# Patient Record
Sex: Male | Born: 1965 | Race: White | Hispanic: No | Marital: Married | Smoking: Never smoker
Health system: Southern US, Community
[De-identification: ages and names within clinical notes are randomized; demographics above are authoritative.]

## PROBLEM LIST (undated history)

## (undated) DIAGNOSIS — K219 Gastro-esophageal reflux disease without esophagitis: Secondary | ICD-10-CM

## (undated) DIAGNOSIS — F419 Anxiety disorder, unspecified: Secondary | ICD-10-CM

## (undated) DIAGNOSIS — N200 Calculus of kidney: Secondary | ICD-10-CM

## (undated) DIAGNOSIS — E785 Hyperlipidemia, unspecified: Secondary | ICD-10-CM

## (undated) HISTORY — PX: POLYPECTOMY: SHX149

## (undated) HISTORY — PX: TONSILLECTOMY: SUR1361

## (undated) HISTORY — DX: Anxiety disorder, unspecified: F41.9

## (undated) HISTORY — DX: Hyperlipidemia, unspecified: E78.5

## (undated) HISTORY — PX: UMBILICAL HERNIA REPAIR: SUR1181

## (undated) HISTORY — PX: COLONOSCOPY: SHX174

## (undated) HISTORY — PX: UMBILICAL HERNIA REPAIR: SHX196

## (undated) HISTORY — DX: Calculus of kidney: N20.0

## (undated) HISTORY — DX: Gastro-esophageal reflux disease without esophagitis: K21.9

---

## 2010-03-28 ENCOUNTER — Encounter: Payer: Self-pay | Admitting: Internal Medicine

## 2010-03-31 ENCOUNTER — Encounter: Payer: Self-pay | Admitting: Internal Medicine

## 2010-03-31 ENCOUNTER — Encounter: Admission: RE | Admit: 2010-03-31 | Discharge: 2010-03-31 | Payer: Self-pay | Admitting: Family Medicine

## 2010-09-15 ENCOUNTER — Encounter (INDEPENDENT_AMBULATORY_CARE_PROVIDER_SITE_OTHER): Payer: Self-pay | Admitting: *Deleted

## 2010-09-15 ENCOUNTER — Ambulatory Visit: Payer: Self-pay | Admitting: Internal Medicine

## 2010-09-15 DIAGNOSIS — Z87442 Personal history of urinary calculi: Secondary | ICD-10-CM | POA: Insufficient documentation

## 2010-09-15 DIAGNOSIS — R198 Other specified symptoms and signs involving the digestive system and abdomen: Secondary | ICD-10-CM

## 2010-09-15 DIAGNOSIS — R358 Other polyuria: Secondary | ICD-10-CM

## 2010-09-15 DIAGNOSIS — E669 Obesity, unspecified: Secondary | ICD-10-CM | POA: Insufficient documentation

## 2010-09-15 DIAGNOSIS — R5381 Other malaise: Secondary | ICD-10-CM

## 2010-09-15 DIAGNOSIS — R5383 Other fatigue: Secondary | ICD-10-CM

## 2010-09-15 DIAGNOSIS — L989 Disorder of the skin and subcutaneous tissue, unspecified: Secondary | ICD-10-CM | POA: Insufficient documentation

## 2010-09-15 LAB — CONVERTED CEMR LAB
AST: 24 units/L (ref 0–37)
Basophils Relative: 0 % (ref 0–1)
Bilirubin, Direct: 0.2 mg/dL (ref 0.0–0.3)
CO2: 26 meq/L (ref 19–32)
Chloride: 105 meq/L (ref 96–112)
Cholesterol: 258 mg/dL — ABNORMAL HIGH (ref 0–200)
Creatinine, Ser: 0.94 mg/dL (ref 0.40–1.50)
HDL: 43 mg/dL (ref >39)
Lymphs Abs: 2.4 10*3/uL (ref 0.7–4.0)
MCHC: 33.9 g/dL (ref 30.0–36.0)
Monocytes Absolute: 0.6 10*3/uL (ref 0.1–1.0)
Monocytes Relative: 8 % (ref 3–12)
Neutro Abs: 4.4 10*3/uL (ref 1.7–7.7)
Neutrophils Relative %: 58 % (ref 43–77)
Potassium: 3.9 meq/L (ref 3.5–5.3)
RBC: 5.19 M/uL (ref 4.22–5.81)
RDW: 12.6 % (ref 11.5–15.5)
Sodium: 144 meq/L (ref 135–145)
TSH: 1.541 microintl units/mL (ref 0.350–4.500)
Total Protein: 7.2 g/dL (ref 6.0–8.3)
VLDL: 47 mg/dL — ABNORMAL HIGH (ref 0–40)

## 2010-09-16 ENCOUNTER — Encounter: Payer: Self-pay | Admitting: Internal Medicine

## 2010-09-16 ENCOUNTER — Encounter (INDEPENDENT_AMBULATORY_CARE_PROVIDER_SITE_OTHER): Payer: Self-pay | Admitting: *Deleted

## 2010-09-16 ENCOUNTER — Telehealth: Payer: Self-pay | Admitting: Internal Medicine

## 2010-09-16 LAB — CONVERTED CEMR LAB: CRP, High Sensitivity: 2.4

## 2010-09-26 ENCOUNTER — Encounter: Payer: Self-pay | Admitting: Internal Medicine

## 2010-10-02 ENCOUNTER — Encounter: Payer: Self-pay | Admitting: Internal Medicine

## 2010-10-13 ENCOUNTER — Ambulatory Visit: Payer: Self-pay | Admitting: Internal Medicine

## 2010-10-13 DIAGNOSIS — E785 Hyperlipidemia, unspecified: Secondary | ICD-10-CM

## 2010-10-13 LAB — CONVERTED CEMR LAB
ALT: 28 units/L (ref 0–53)
Albumin: 4.8 g/dL (ref 3.5–5.2)
Alkaline Phosphatase: 77 units/L (ref 39–117)
Total Protein: 7 g/dL (ref 6.0–8.3)

## 2010-10-15 ENCOUNTER — Telehealth: Payer: Self-pay | Admitting: Internal Medicine

## 2010-10-16 ENCOUNTER — Encounter (INDEPENDENT_AMBULATORY_CARE_PROVIDER_SITE_OTHER): Payer: Self-pay | Admitting: *Deleted

## 2010-10-16 ENCOUNTER — Encounter: Payer: Self-pay | Admitting: Internal Medicine

## 2010-10-22 ENCOUNTER — Ambulatory Visit: Payer: Self-pay | Admitting: Internal Medicine

## 2010-10-22 ENCOUNTER — Encounter (INDEPENDENT_AMBULATORY_CARE_PROVIDER_SITE_OTHER): Payer: Self-pay | Admitting: *Deleted

## 2010-11-03 ENCOUNTER — Ambulatory Visit: Payer: Self-pay | Admitting: Internal Medicine

## 2010-11-03 LAB — HM COLONOSCOPY

## 2010-11-05 ENCOUNTER — Encounter: Payer: Self-pay | Admitting: Internal Medicine

## 2011-01-01 ENCOUNTER — Telehealth: Payer: Self-pay | Admitting: Internal Medicine

## 2011-01-20 NOTE — Letter (Signed)
Summary: New Patient letter  [facility]  [address] [place], [place] [address]   Phone: [phone]  Fax: [phone]       09/15/2010 MRN: [id number]  [facility] [address] [place], [place]  [address]  Dear Mr. Jorge Guerra,  Welcome to the Gastroenterology Division at [facility].    You are scheduled to see Dr. Leone Payor on 11/03/2010 at 2:00PM on the 3rd floor at [facility], [address]  We ask that you try to arrive at our office 15 minutes prior to your appointment time to allow for check-in.  We would like you to complete the enclosed self-administered evaluation form prior to your visit and bring it with you on the day of your appointment.  We will review it with you.  Also, please bring a complete list of all your medications or, if you prefer, bring the medication bottles and we will list them.  Please bring your insurance card so that we may make a copy of it.  If your insurance requires a referral to see a specialist, please bring your referral form from your primary care physician.  Co-payments are due at the time of your visit and may be paid by cash, check or credit card.     Your office visit will consist of a consult with your physician (includes a physical exam), any laboratory testing he/she may order, scheduling of any necessary diagnostic testing (e.g. x-ray, ultrasound, CT-scan), and scheduling of a procedure (e.g. Endoscopy, Colonoscopy) if required.  Please allow enough time on your schedule to allow for any/all of these possibilities.    If you cannot keep your appointment, please call [phone] to cancel or reschedule prior to your appointment date.  This allows [place] the opportunity to schedule an appointment for another patient in need of care.  If you do not cancel or reschedule by 5 p.m. the business day prior to your appointment date, you will be charged a $50.00 late cancellation/no-show fee.    Thank you for choosing [facility] for your medical needs.  We appreciate the opportunity to care for you.  Please visit [place] at our website  to learn more about our practice.                     Sincerely,                                                             The Gastroenterology Division

## 2011-01-20 NOTE — Consult Note (Signed)
Summary: [facility]  [facility]   Imported By: Lanelle Bal 11/05/2010 08:33:55  _____________________________________________________________________  External Attachment:    Type:   Image     Comment:   External Document

## 2011-01-20 NOTE — Letter (Signed)
Summary: [facility] Instructions  [facility]  [address] [place], [place] [address]   Phone: [phone]  Fax: [phone]       Jorge Guerra    [birth year]    MRN: [id number]        Procedure Day Dorna Bloom:  Jorge Guerra  11/03/10     Arrival Time:  8:00AM      Procedure Time:  9:00AM     Location of Procedure:                    _ X_  [facility] (4th Floor)                      PREPARATION FOR COLONOSCOPY WITH MOVIPREP   Starting 5 days prior to your procedure 10/29/10 do not eat nuts, seeds, popcorn, corn, beans, peas,  salads, or any raw vegetables.  Do not take any fiber supplements (e.g. Metamucil, Citrucel, and Benefiber).  THE DAY BEFORE YOUR PROCEDURE         DATE: 11/02/10  DAY: SUNDAY  1.  Drink clear liquids the entire day-NO SOLID FOOD  2.  Do not drink anything colored red or purple.  Avoid juices with pulp.  No orange juice.  3.  Drink at least 64 oz. (8 glasses) of fluid/clear liquids during the day to prevent dehydration and help the prep work efficiently.  CLEAR LIQUIDS INCLUDE: Water Jello Ice Popsicles Tea (sugar ok, no milk/cream) Powdered fruit flavored drinks Coffee (sugar ok, no milk/cream) Gatorade Juice: apple, white grape, white cranberry  Lemonade Clear bullion, consomm, broth Carbonated beverages (any kind) Strained chicken noodle soup Hard Candy                             4.  In the morning, mix first dose of MoviPrep solution:    Empty 1 Pouch A and 1 Pouch B into the disposable container    Add lukewarm drinking water to the top line of the container. Mix to dissolve    Refrigerate (mixed solution should be used within 24 hrs)  5.  Begin drinking the prep at 5:00 p.m. The MoviPrep container is divided by 4 marks.   Every 15 minutes drink the solution down to the next Jorge Guerra (approximately 8 oz) until the full liter is complete.   6.  Follow completed prep with 16 oz of clear liquid of your choice (Nothing  red or purple).  Continue to drink clear liquids until bedtime.  7.  Before going to bed, mix second dose of MoviPrep solution:    Empty 1 Pouch A and 1 Pouch B into the disposable container    Add lukewarm drinking water to the top line of the container. Mix to dissolve    Refrigerate  THE DAY OF YOUR PROCEDURE      DATE: 47/14/11   DAY: MONDAY  Beginning at 4:00AM (5 hours before procedure):         1. Every 15 minutes, drink the solution down to the next Jorge Guerra (approx 8 oz) until the full liter is complete.  2. Follow completed prep with 16 oz. of clear liquid of your choice.    3. You may drink clear liquids until 7:00AM (2 HOURS BEFORE PROCEDURE).   MEDICATION INSTRUCTIONS  Unless otherwise instructed, you should take regular prescription medications with a small sip of water   as early as possible the morning  of your procedure.        OTHER INSTRUCTIONS  You will need a responsible adult at least 47 years of age to accompany you and drive you home.   This person must remain in the waiting room during your procedure.  Wear loose fitting clothing that is easily removed.  Leave jewelry and other valuables at home.  However, you may wish to bring a book to read or  an iPod/MP3 player to listen to music as you wait for your procedure to start.  Remove all body piercing jewelry and leave at home.  Total time from sign-in until discharge is approximately 2-3 hours.  You should go home directly after your procedure and rest.  You can resume normal activities the  day after your procedure.  The day of your procedure you should not:   Drive   Make legal decisions   Operate machinery   Drink alcohol   Return to work  You will receive specific instructions about eating, activities and medications before you leave.    The above instructions have been reviewed and explained to me by   [name] RN  October 22, 2010 2:24 PM    I fully understand and can  verbalize these instructions _____________________________ Date _________

## 2011-01-20 NOTE — Letter (Signed)
Summary: Patient Notice- Polyp Results  [facility]  [address] [place], [place] [address]   Phone: [phone]  Fax: [phone]        November 05, 2010 MRN: [id number]    [facility] [address] [place], [place]  [address]    Dear Jorge Guerra,  I am pleased to inform you that the colon polyp(s) removed during your recent colonoscopy was (were) found to be benign (no cancer detected) upon pathologic examination.  I recommend you have a repeat colonoscopy examination in 3 years to look for recurrent polyps, as having colon polyps increases your risk for having recurrent polyps or even colon cancer in the future.  Should you develop new or worsening symptoms of abdominal pain, bowel habit changes or bleeding from the rectum or bowels, please schedule an evaluation with either your primary care physician or with me.  Additional information/recommendations:  __ No further action with gastroenterology is needed at this time. Please      follow-up with your primary care physician for your other healthcare      needs.    Please call us if you are having persistent problems or have questions about your condition that have not been fully answered at this time.  Sincerely,  Hilarie Fredrickson MD  This letter has been electronically signed by your physician.  Appended Document: Patient Notice- Polyp Results Letter mailed

## 2011-01-20 NOTE — Letter (Signed)
Summary: [facility]  [facility]   Imported By: Lanelle Bal 09/23/2010 10:11:13  _____________________________________________________________________  External Attachment:    Type:   Image     Comment:   External Document

## 2011-01-20 NOTE — Procedures (Signed)
Summary: Colonoscopy  Patient: [name] Note: All result statuses are Final unless otherwise noted.  Tests: (1) Colonoscopy (COL)   COL Colonoscopy           DONE     [facility]     [address]     [place], [place]  [address]           COLONOSCOPY PROCEDURE REPORT           PATIENT:  Jorge Guerra, Jorge Guerra  MR#:  [id number]     BIRTHDATE:  03-Nov-1966, 44 yrs. old  GENDER:  male     ENDOSCOPIST:  Wilhemina Bonito. Eda Keys, MD     REF. BY:  Thomos Lemons, DO     PROCEDURE DATE:  11/03/2010     PROCEDURE:  Colonoscopy with snare polypectomy x 4     ASA CLASS:  Class II     INDICATIONS:  Elevated Risk Screening ; Family hx "cancer", not     certain it's colon     MEDICATIONS:   Fentanyl 75 mcg IV, Versed 7 mg IV           DESCRIPTION OF PROCEDURE:   After the risks benefits and     alternatives of the procedure were thoroughly explained, informed     consent was obtained.  Digital rectal exam was performed and     revealed no abnormalities.   The LB CF-[id number] endoscope     was introduced through the anus and advanced to the cecum, which     was identified by both the appendix and ileocecal valve, without     limitations.Time to cecum = 2:25 min.  The quality of the prep was     excellent, using MoviPrep.  The instrument was then slowly     withdrawn (time = 15:02 min) as the colon was fully examined.     <<PROCEDUREIMAGES>>           FINDINGS:  A diminutive polyp was found in the ascending colon.     Polyp was snared without cautery. Retrieval was successful.  A     15mm pedunculated polyp was found in the descending colon. Polyp     was snared, then cauterized with monopolar cautery. Retrieval was     successful.   Two 2mm polyps were found in the rectum. Polyps were     snared without cautery. Retrieval was successful.    Retroflexed     views in the rectum revealed internal hemorrhoids.    The scope     was then withdrawn from the patient and the procedure completed.       COMPLICATIONS:  None     ENDOSCOPIC IMPRESSION:     1) Diminutive polyp in the ascending colon - removed     2) 15mm Pedunculated polyp in the descending colon - removed     3) Two tiny polyps in the rectum - removed     4) Internal hemorrhoids     RECOMMENDATIONS:     1) Follow up colonoscopy in 3 years     ______________________________     Wilhemina Bonito. Eda Keys, MD           CC:  Thomos Lemons, DO;  The Patient           n.     eSIGNED:   John N. Eda Keys at 11/03/2010 09:41 AM           Dorothy Puffer, [id number]  Note: An exclamation Zhyon (!) indicates a result that was not dispersed into the flowsheet. Document Creation Date: 11/03/2010 9:42 AM _______________________________________________________________________  (1) Order result status: Final Collection or observation date-time: 11/03/2010 09:32 Requested date-time:  Receipt date-time:  Reported date-time:  Referring Physician:   Ordering Physician: Fransico Setters [phone]) Specimen Source:  Source: Launa Grill Order Number: [id number] Lab site:   Appended Document: colon recall     Procedures Next Due Date:    Colonoscopy: 10/2013

## 2011-01-20 NOTE — Miscellaneous (Signed)
Summary: [facility] Previsit/prep  Clinical Lists Changes  Medications: Added new medication of MOVIPREP 100 GM  SOLR (PEG-KCL-NACL-NASULF-NA ASC-C) As per prep instructions. - Signed Rx of MOVIPREP 100 GM  SOLR (PEG-KCL-NACL-NASULF-NA ASC-C) As per prep instructions.;  #1 x 0;  Signed;  Entered by: [name] RN;  Authorized by: Hilarie Fredrickson MD;  Method used: Electronically to [facility]  [phone]*, [address] [place], [place], [place]  [address], Ph: [phone] or [phone], Fax: [phone] Allergies: Added new allergy or adverse reaction of * RADIOACTIVE I V DYE Observations: Added new observation of NKA: F (10/22/2010 13:39)    Prescriptions: MOVIPREP 100 GM  SOLR (PEG-KCL-NACL-NASULF-NA ASC-C) As per prep instructions.  #1 x 0   Entered by:   [name] RN   Authorized by:   Hilarie Fredrickson MD   Signed by:   [name] RN on 10/22/2010   Method used:   Electronically to        [facility]  [address] [phone]* (retail)       [address]       [place], [place]  [address]       Ph: [phone] or [phone]       Fax: [phone]   RxID:   [id number]

## 2011-01-20 NOTE — Letter (Signed)
   [place] at [facility] [address] [place], [place]  [address]  [place] Phone: [phone]      October 16, 2010   [facility] Yeager [address] [place], [place] [address]  RE:  LAB RESULTS  Dear  Mr. Dettman,  The following is an interpretation of your most recent lab tests.  Please take note of any instructions provided or changes to medications that have resulted from your lab work.  LIVER FUNCTION TESTS:  Good - no changes needed  LIPID PANEL:  Good - no changes needed Triglyceride: 134   Cholesterol: 160   LDL: 88   HDL: 45   Chol/HDL%:  3.6 Ratio        Sincerely Yours,    Dr. Thomos Lemons  Appended Document:  mailed

## 2011-01-20 NOTE — Assessment & Plan Note (Signed)
Summary: new to be est cigna/mhf   Vital Signs:  Patient profile:   45 year old male Height:      68.5 inches Weight:      232.75 pounds BMI:     35.00 O2 Sat:      97 % on Room air Temp:     97.6 degrees F oral Pulse rate:   57 / minute Pulse rhythm:   regular Resp:     18 per minute BP sitting:   106 / 80  (right arm) Cuff size:   large  Vitals Entered By: Glendell Docker CMA (September 15, 2010 1:19 PM)  O2 Flow:  Room air CC: New Patient  Is Patient Diabetic? No Pain Assessment Patient in pain? no      Comments c/o abdominal discomfort, tightness in abdomen   Primary Care Provider:  Dondra Spry DO  CC:  New Patient .  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  they noted bradycardia  urine sample showed blood in the urine he had CT scan of abd and pelvis - reported normal  Current Diet: Breakfast: usually skips Lunch: sausage soup at     , [employer],  country fried steak and mashed potatoes,  occ fast food DInner: 2 sandwiches and bowel of soup,  love cheese sticks Snacks: ice cream,  chips, nabs Beverage:  sweet tea,   Preventive Screening-Counseling & Management  Alcohol-Tobacco     Alcohol drinks/day: twice per month-socially     Smoking Status: never  Caffeine-Diet-Exercise     Caffeine use/day: gallon daily     Does Patient Exercise: no  Allergies (verified): No Known Drug Allergies  Past History:  Past Medical History: Nephrolithiasis, hx of  Family History: Family History Diabetes 1st degree  relative Family History Hypertension Mother has CLL, fibromyalgia father died of copd,  DMII ( smoker, drinker)  Social History: Occupation: Environmental consultant (Chiropodist and Biochemist, clinical) Married 15 years - 4th marriage 2 biological sons 25, 73  2-  1 stepson 1 biological daughter 36, 2 step daughters 43, 32 (mother - Scientist, physiological) Alcohol use-yes ( rare , social ) Never Smoked Smoking Status:  never Caffeine use/day:  gallon daily Does Patient Exercise:  no  Review of Systems       poor sleep  Physical Exam  General:  alert and overweight-appearing.   Head:  normocephalic and atraumatic.   Eyes:  pupils equal, pupils round, and pupils reactive to light.   Ears:  decreased hearing left ear Mouth:  pharynx pink and moist and pharyngeal crowding.   Neck:  supple and no masses.  question fullness of bilateral sterno cleido mastoid muscles bilaterally Lungs:  normal respiratory effort, normal breath sounds, no crackles, and no wheezes.   Heart:  normal rate, regular rhythm, no murmur, and no gallop.   Abdomen:  obese, soft,  abd tenderness right of umbilicus,  no masses, no hepatomegaly, and no splenomegaly.  Extremities:  No lower extremity edema Neurologic:  cranial nerves II-XII intact and gait normal.   Skin:  3-4 mm hyperpigmented lesion right upper trapezius area.   multiple scattered moles on back, chest and abd Psych:  normally interactive, good eye contact, not anxious appearing, and not depressed appearing.     Impression & Recommendations:  Problem # 1:  OTHER SYMPTOMS INVOLVING DIGESTIVE SYSTEM OTHER (ICD-787.99)  pt with change in bowel habits.    Orders: Gastroenterology Referral (GI)  Problem # 2:  OBESITY (ICD-278.00) Pt counseled on diet and exercise.  Orders: T-Basic Metabolic Panel [id number]) T-Lipid Profile [id number]) T-Hepatic Function [phone]) T-CBC w/Diff [id number]) T-TSH [id number])  Problem # 3:  POLYURIA  (UUV-253.66) rule out hyperglycemia  Orders: T-Basic Metabolic Panel [id number]) T-Lipid Profile [id number]) T-Hepatic Function [phone]) T-CBC w/Diff [id number]) T-TSH [id number])  Problem # 4:  SKIN LESION (ICD-709.9)  hyperpigmented skin lesion right shoulder / upper back refer to [facility] biopsy  Orders: Dermatology Referral ([facility])  Complete Medication List: 1)  Crestor 10 Mg Tabs (Rosuvastatin calcium) .... One by mouth once daily  Patient Instructions: 1)  Please schedule a follow-up appointment in 1 month.  Prevention & Chronic Care Immunizations   Influenza vaccine: Not documented    Tetanus booster: Not documented    Pneumococcal vaccine: Not documented  Other Screening   Smoking status: never  (09/15/2010)  Lipids   Total Cholesterol: Not documented   LDL: Not documented   LDL Direct: Not documented   HDL: Not documented   Triglycerides: Not documented   Current Allergies (reviewed today): No known allergies

## 2011-01-20 NOTE — Progress Notes (Signed)
Summary: Lab Results  Phone Note Call from Patient Call back at Home Phone [phone]   Caller: Patient Call For: YOO  Summary of Call: PATIENT WOULD LIKE RESULTS OF HIS CHOLESTEROL TEST  Initial call taken by: Roselle Locus,  October 15, 2010 3:43 PM  Follow-up for Phone Call        cholesterol is much better.  I will send letter  Follow-up by: D. Thomos Lemons DO,  October 16, 2010 1:17 PM  Additional Follow-up for Phone Call Additional follow up Details #1::        call returned to patient at [phone], no answer. A detailed voice message was left informing patient per Dr Artist Pais instructions. Message was left for patient to call if any questions Additional Follow-up by: Glendell Docker CMA,  October 16, 2010 1:19 PM

## 2011-01-20 NOTE — Letter (Signed)
Summary: Primary Care Consult Scheduled Letter  [place] [facility]  [address]   [place], [place] [address]   Phone: [phone]  Fax: [phone]      09/16/2010 MRN: [id number]  [facility] [address] [place], [place]  [address]    Dear Mr. Goggins,      We have scheduled an appointment for you.  At the recommendation of Dr. Artist Pais, we have scheduled you a consult with [facility]  , Margarito Courser on OCTOBER 7,2011  at 10:30AM.  Their address is[address], [place] . The office phone number is [phone].  If this appointment day and time is not convenient for you, please feel free to call the office of the doctor you are being referred to at the number listed above and reschedule the appointment.     It is important for you to keep your scheduled appointments. We are here to make sure you are given good patient care. If you have questions or you have made changes to your appointment, please notify us at  [phone], ask for HELEN.    Thank you,  Patient Care Coordinator [place] [facility]

## 2011-01-20 NOTE — Progress Notes (Signed)
Summary: lab results   Phone Note Call from Patient Call back at Home Phone [phone]   Caller: patient  Call For: Jorge Guerra  Summary of Call: lab results   Initial call taken by: Roselle Locus,  September 16, 2010 12:12 PM  Follow-up for Phone Call        discussed lab results with pt pt to come in on Monday to pick up samples of crestor 10 mg  Follow-up by: D. Thomos Lemons DO,  September 16, 2010 6:28 PM  Additional Follow-up for Phone Call Additional follow up Details #1::        samples left at front desk for patient pick up Additional Follow-up by: Glendell Docker CMA,  September 17, 2010 8:30 AM    New/Updated Medications: CRESTOR 10 MG TABS (ROSUVASTATIN CALCIUM) one by mouth once daily Prescriptions: CRESTOR 10 MG TABS (ROSUVASTATIN CALCIUM) one by mouth once daily  #30 x 0   Entered and Authorized by:   D. Thomos Lemons DO   Signed by:   D. Thomos Lemons DO on 09/16/2010   Method used:   Historical   RxID:   [id number]

## 2011-01-20 NOTE — Letter (Signed)
Summary: [facility]  [facility]   Imported By: Lanelle Bal 09/23/2010 10:09:34  _____________________________________________________________________  External Attachment:    Type:   Image     Comment:   External Document

## 2011-01-20 NOTE — Assessment & Plan Note (Signed)
Summary: 1 month follow up/mhf   Vital Signs:  Patient profile:   45 year old male Height:      68.5 inches Weight:      230.75 pounds BMI:     34.70 O2 Sat:      98 % on Room air Temp:     98.2 degrees F oral Pulse rate:   61 / minute Pulse rhythm:   regular Resp:     18 per minute BP sitting:   90 / 70  (right arm) Cuff size:   large  Vitals Entered By: Glendell Docker CMA (October 13, 2010 3:06 PM)  O2 Flow:  Room air CC: 1 month follow up Is Patient Diabetic? No Pain Assessment Patient in pain? no      Comments fasting for blood work if needed, no concerns   Primary Care Provider:  D. Thomos Lemons DO  CC:  1 month follow up.  History of Present Illness:  Hyperlipidemia Follow-Up      This is a 46 year old man who presents for Hyperlipidemia follow-up.  The patient denies muscle aches and abdominal pain.  The patient denies the following symptoms: chest pain/pressure.  Compliance with medications (by patient report) has been near 100%.    lost 7 lbs   Preventive Screening-Counseling & Management  Alcohol-Tobacco     Smoking Status: never  Allergies: 1)  ! * Radioactive I V Dye  Past History:  Past Medical History: Nephrolithiasis, hx of   SH/Risk Factors reviewed for relevance  Family History: Family History Diabetes 1st degree relative Family History Hypertension Mother has CLL, fibromyalgia father died of copd,  DMII ( smoker, drinker)   Social History: Occupation: Environmental consultant (Chiropodist and Biochemist, clinical) Married 15 years - 4th marriage 2 biological sons 25, 31  2-  1 stepson 1 biological daughter 30, 2 step daughters 73, 33 (mother - Scientist, physiological) Alcohol use-yes ( rare , social )  Never Smoked  Physical Exam  General:  alert, well-developed, and well-nourished.   Lungs:  normal respiratory effort, normal breath sounds, no crackles, and no wheezes.   Heart:  normal rate, regular rhythm, no murmur, and no gallop.     Extremities:  No lower extremity edema   Impression & Recommendations:  Problem # 1:  HYPERLIPIDEMIA (ICD-272.4) Assessment Improved  The following medications were removed from the medication list:    Crestor 10 Mg Tabs (Rosuvastatin calcium) ..... One by mouth once daily His updated medication list for this problem includes:    Crestor 10 Mg Tabs (Rosuvastatin calcium) ..... One by mouth once daily  Orders: T-Hepatic Function [phone]) T-Lipid Profile [phone])  Labs Reviewed: SGOT: 24 (09/15/2010)   SGPT: 27 (09/15/2010)   HDL:43 (09/15/2010)  LDL:168 (09/15/2010)  Chol:258 (09/15/2010)  Trig:237 (09/15/2010)  Complete Medication List: 1)  Crestor 10 Mg Tabs (Rosuvastatin calcium) .... One by mouth once daily 2)  Moviprep 100 Gm Solr (Peg-kcl-nacl-nasulf-na asc-c) .... As per prep instructions.  Patient Instructions: 1)  Please schedule a follow-up appointment in 6 months. 2)  Hepatic Panel prior to visit, ICD-9:  272.4 3)  Lipid Panel prior to visit, ICD-9: 272.4 4)  Please return for lab work one (1) week before your next appointment.  Prescriptions: CRESTOR 10 MG TABS (ROSUVASTATIN CALCIUM) one by mouth once daily  #90 x 1   Entered and Authorized by:   D. Thomos Lemons DO   Signed by:   D. Thomos Lemons DO on 10/13/2010  Method used:   Electronically to        [facility]  [phone] (retail)       [address]       [place], [place]  [address]       Ph: [phone] or [phone]       Fax: [phone]   RxID:   [id number]    Orders Added: 1)  T-Hepatic Function [phone] 2)  T-Lipid Profile [phone] 3)  Est. Patient Level III [18841]   Immunization History:  Influenza Immunization History:    Influenza:  declined (10/13/2010)   Contraindications/Deferment of Procedures/Staging:    Test/Procedure: FLU VAX    Reason for deferment: patient declined   Immunization History:  Influenza Immunization History:     Influenza:  Declined (10/13/2010)   Current Allergies (reviewed today): No known allergies

## 2011-01-20 NOTE — Letter (Signed)
Summary: Pre Visit Letter Revised  [facility]  [address] [place], [place] [address]   Phone: [phone]  Fax: [phone]        10/16/2010 MRN: [id number] Jorge Guerra [address] [place], [place]  [address]             Procedure Date:  11-03-10   Welcome to the Gastroenterology Division at [facility].    You are scheduled to see a nurse for your pre-procedure visit on 10-22-10 at 2:00p.m. on the 3rd floor at [facility], [address]  We ask that you try to arrive at our office 15 minutes prior to your appointment time to allow for check-in.  Please take a minute to review the attached form.  If you answer "Yes" to one or more of the questions on the first page, we ask that you call the person listed at your earliest opportunity.  If you answer "No" to all of the questions, please complete the rest of the form and bring it to your appointment.    Your nurse visit will consist of discussing your medical and surgical history, your immediate family medical history, and your medications.   If you are unable to list all of your medications on the form, please bring the medication bottles to your appointment and we will list them.  We will need to be aware of both prescribed and over the counter drugs.  We will need to know exact dosage information as well.    Please be prepared to read and sign documents such as consent forms, a financial agreement, and acknowledgement forms.  If necessary, and with your consent, a friend or relative is welcome to sit-in on the nurse visit with you.  Please bring your insurance card so that we may make a copy of it.  If your insurance requires a referral to see a specialist, please bring your referral form from your primary care physician.  No co-pay is required for this nurse visit.     If you cannot keep your appointment, please call [phone] to cancel or reschedule prior to your appointment date.  This allows [place] the  opportunity to schedule an appointment for another patient in need of care.    Thank you for choosing  Gastroenterology for your medical needs.  We appreciate the opportunity to care for you.  Please visit [place] at our website  to learn more about our practice.  Sincerely, The Gastroenterology Division

## 2011-01-21 ENCOUNTER — Encounter: Payer: Self-pay | Admitting: Internal Medicine

## 2011-01-22 NOTE — Progress Notes (Signed)
Summary: refill--crestor  Phone Note Refill Request Message from:  Fax from [facility] Delivery on January 01, 2011 2:21 PM  Refills Requested: Medication #1:  CRESTOR 10 MG TABS one by mouth once daily.   Dosage confirmed as above?Dosage Confirmed   Supply Requested: 3 months   Last Refilled: 10/13/2010 Member ID [id number]  Next Appointment Scheduled: 03-30-11  Dr Artist Pais Initial call taken by: Mervin Kung CMA Duncan Dull),  January 01, 2011 2:21 PM    Prescriptions: CRESTOR 10 MG TABS (ROSUVASTATIN CALCIUM) one by mouth once daily  #90 x 0   Entered by:   Mervin Kung CMA (AAMA)   Authorized by:   D. Thomos Lemons DO   Signed by:   Mervin Kung CMA (AAMA) on 01/01/2011   Method used:   Faxed to ...       [address] [facility] (mail-order)       [address]       [place], [place]  [address]       Ph: [phone]       Fax: [phone]   RxID:   [id number]

## 2011-03-30 ENCOUNTER — Ambulatory Visit: Payer: Self-pay | Admitting: Internal Medicine

## 2011-04-09 ENCOUNTER — Ambulatory Visit: Payer: Self-pay | Admitting: Internal Medicine

## 2011-04-10 ENCOUNTER — Encounter: Payer: Self-pay | Admitting: Internal Medicine

## 2011-04-10 ENCOUNTER — Ambulatory Visit (INDEPENDENT_AMBULATORY_CARE_PROVIDER_SITE_OTHER): Payer: Managed Care, Other (non HMO) | Admitting: Internal Medicine

## 2011-04-10 DIAGNOSIS — R0609 Other forms of dyspnea: Secondary | ICD-10-CM

## 2011-04-10 DIAGNOSIS — R358 Other polyuria: Secondary | ICD-10-CM

## 2011-04-10 DIAGNOSIS — R0600 Dyspnea, unspecified: Secondary | ICD-10-CM

## 2011-04-10 DIAGNOSIS — M2550 Pain in unspecified joint: Secondary | ICD-10-CM

## 2011-04-10 LAB — CBC
HCT: 44.8 % (ref 39.0–52.0)
Hemoglobin: 15.2 g/dL (ref 13.0–17.0)
MCH: 29.5 pg (ref 26.0–34.0)
MCHC: 33.9 g/dL (ref 30.0–36.0)
MCV: 87 fL (ref 78.0–100.0)
Platelets: 207 10*3/uL (ref 150–400)
RBC: 5.15 MIL/uL (ref 4.22–5.81)
RDW: 12.9 % (ref 11.5–15.5)

## 2011-04-10 LAB — HEMOGLOBIN A1C: Mean Plasma Glucose: 108 mg/dL (ref <117)

## 2011-04-10 LAB — URIC ACID: Uric Acid, Serum: 5.8 mg/dL (ref 4.0–7.8)

## 2011-04-10 NOTE — Patient Instructions (Signed)
Use right wrist splint x 4 weeks Perform stretching exercises as directed for 2-4 weeks

## 2011-04-10 NOTE — Progress Notes (Signed)
  Subjective:    Patient ID: Jorge Guerra, male    DOB: 1966/11/15, 45 y.o.   MRN: [id number]  HPI 45 y/o male for follow up.  Hyperlipidemia -  1 month ago - he stopped taking crestor due to right hand ache and left hip.  His symptoms did not resolve with cessation of statin medication.  Last week played golf  - got short of breath and  unusual fatigue.  Denies angina but notes uncomfortable chest sensation   Review of Systems    chronic aches and pains,  Negative for cough  Past Medical History  Diagnosis Date  . Nephrolithiasis     Hx of microscopic hematuria.  Negative CT of abd and Pelvis for renal lesion    History   Social History  . Marital Status: Married    Spouse Name: N/A    Number of Children: 3  . Years of Education: N/A   Occupational History  . General Restaurant manager, fast food Store    Social History Main Topics  . Smoking status: Never Smoker   . Smokeless tobacco: Not on file  . Alcohol Use: Yes  . Drug Use:   . Sexually Active:    Other Topics Concern  . Not on file   Social History Narrative   Has 1 stepson and 2 step daughters.    No past surgical history on file.  Family History  Problem Relation Age of Onset  . Leukemia Mother     CLL  . Fibromyalgia Mother   . COPD Father   . Diabetes Father     type II    No Known Allergies  No current outpatient prescriptions on file prior to visit.    BP 112/80  Pulse 59  Temp(Src) 97.9 F (36.6 C) (Oral)  Wt 222 lb (100.699 kg)  SpO2 97%    Objective:   Physical Exam  Constitutional: He appears well-developed and well-nourished.  Neck:       No carotid bruit  Cardiovascular: Normal rate, regular rhythm and normal heart sounds.  Exam reveals no gallop.   Pulmonary/Chest: Effort normal and breath sounds normal. He has no wheezes. He has no rales.  Musculoskeletal: He exhibits no edema.  Skin: Skin is warm and dry.  Psychiatric: He has a normal mood and affect.           Assessment & Plan:

## 2011-04-11 LAB — BASIC METABOLIC PANEL WITH GFR
GFR, Est African American: >60 mL/min (ref >60)
GFR, Est Non African American: >60 mL/min (ref >60)

## 2011-04-13 ENCOUNTER — Encounter: Payer: Self-pay | Admitting: Internal Medicine

## 2011-04-21 ENCOUNTER — Telehealth: Payer: Self-pay | Admitting: Internal Medicine

## 2011-04-21 NOTE — Telephone Encounter (Signed)
Pt want results of Lab   (Cholesterol)

## 2011-04-22 ENCOUNTER — Encounter (HOSPITAL_COMMUNITY): Payer: Managed Care, Other (non HMO) | Admitting: Radiology

## 2011-04-22 NOTE — Telephone Encounter (Signed)
Electrolytes, kidney function,  Uric acid, sed rate, CBC - normal Cholesterol was not ordered

## 2011-04-23 NOTE — Telephone Encounter (Signed)
Call placed to patient at [phone], no answer, no voice mail.  Call placed to patient at [phone], no answer A detailed voice message was left informing patient per Dr Artist Pais instructions

## 2011-04-27 ENCOUNTER — Encounter: Payer: Self-pay | Admitting: Internal Medicine

## 2011-04-27 ENCOUNTER — Other Ambulatory Visit (HOSPITAL_COMMUNITY): Payer: Self-pay | Admitting: Internal Medicine

## 2011-04-27 DIAGNOSIS — M2550 Pain in unspecified joint: Secondary | ICD-10-CM | POA: Insufficient documentation

## 2011-04-27 DIAGNOSIS — R0600 Dyspnea, unspecified: Secondary | ICD-10-CM | POA: Insufficient documentation

## 2011-04-27 NOTE — Assessment & Plan Note (Addendum)
Pt stopped taking statin due to complaints of shoulder and hip pain.  No improvement with stopping crestor.  I doubt PMR - check sed rate. 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  Trial of wrist splint.

## 2011-04-27 NOTE — Assessment & Plan Note (Signed)
45 y/o male with unexplained dyspnea. Rule out cardiac etiology  Arrange stress echo.

## 2011-04-28 ENCOUNTER — Other Ambulatory Visit (HOSPITAL_COMMUNITY): Payer: Managed Care, Other (non HMO)

## 2011-04-28 ENCOUNTER — Other Ambulatory Visit (HOSPITAL_COMMUNITY): Payer: Managed Care, Other (non HMO) | Admitting: Radiology

## 2011-04-29 ENCOUNTER — Other Ambulatory Visit (HOSPITAL_COMMUNITY): Payer: Self-pay | Admitting: Internal Medicine

## 2011-04-29 ENCOUNTER — Ambulatory Visit (HOSPITAL_COMMUNITY): Payer: Managed Care, Other (non HMO) | Attending: Internal Medicine | Admitting: Radiology

## 2011-04-29 ENCOUNTER — Ambulatory Visit (HOSPITAL_BASED_OUTPATIENT_CLINIC_OR_DEPARTMENT_OTHER): Payer: Managed Care, Other (non HMO) | Admitting: Radiology

## 2011-04-29 DIAGNOSIS — R0989 Other specified symptoms and signs involving the circulatory and respiratory systems: Secondary | ICD-10-CM

## 2011-04-29 DIAGNOSIS — E785 Hyperlipidemia, unspecified: Secondary | ICD-10-CM | POA: Insufficient documentation

## 2011-04-29 DIAGNOSIS — E669 Obesity, unspecified: Secondary | ICD-10-CM | POA: Insufficient documentation

## 2011-04-29 DIAGNOSIS — Z8249 Family history of ischemic heart disease and other diseases of the circulatory system: Secondary | ICD-10-CM | POA: Insufficient documentation

## 2011-04-29 DIAGNOSIS — R072 Precordial pain: Secondary | ICD-10-CM

## 2011-04-29 DIAGNOSIS — R42 Dizziness and giddiness: Secondary | ICD-10-CM | POA: Insufficient documentation

## 2011-04-29 DIAGNOSIS — R0609 Other forms of dyspnea: Secondary | ICD-10-CM | POA: Insufficient documentation

## 2011-05-01 ENCOUNTER — Telehealth: Payer: Self-pay | Admitting: *Deleted

## 2011-05-01 NOTE — Telephone Encounter (Signed)
Patient was advised of test results. Patient is very anxious and, he has  high anxiety with regards to his  family history of leukemia. He states that his 47 year old brother was diagnosed with ALL-a form of Leukemia after passing out at his wedding rehearsal on Saturday. He was vomiting blood and rushed to [facility] then transferred to [facility]. Patient expressed that his pain in his joints have not improved and he believes that his symptoms have to be coming from somewhere. He is is concerned that having lost his mother, uncle to leukemia, and now a brother diagnosed with it, he is requested to be checked. He was offered an earlier appointment with Dr Artist Pais, but he declines stating that he works on the other side of [place] it would be very difficult for him to get off of work. He stated that he will keep his upcoming appointment with Dr Artist Pais  On May 23rd at 9 am.

## 2011-05-06 ENCOUNTER — Encounter: Payer: Self-pay | Admitting: Internal Medicine

## 2011-05-08 ENCOUNTER — Encounter: Payer: Self-pay | Admitting: Internal Medicine

## 2011-05-13 ENCOUNTER — Ambulatory Visit (HOSPITAL_BASED_OUTPATIENT_CLINIC_OR_DEPARTMENT_OTHER)
Admission: RE | Admit: 2011-05-13 | Discharge: 2011-05-13 | Disposition: A | Discharge status: Home / Self Care | Payer: Managed Care, Other (non HMO) | Source: Ambulatory Visit | Attending: Internal Medicine | Admitting: Internal Medicine

## 2011-05-13 ENCOUNTER — Ambulatory Visit (INDEPENDENT_AMBULATORY_CARE_PROVIDER_SITE_OTHER)
Admission: RE | Admit: 2011-05-13 | Discharge: 2011-05-13 | Disposition: A | Discharge status: Home / Self Care | Payer: Managed Care, Other (non HMO) | Source: Ambulatory Visit | Attending: Internal Medicine | Admitting: Internal Medicine

## 2011-05-13 ENCOUNTER — Ambulatory Visit (INDEPENDENT_AMBULATORY_CARE_PROVIDER_SITE_OTHER): Payer: Managed Care, Other (non HMO) | Admitting: Internal Medicine

## 2011-05-13 ENCOUNTER — Encounter: Payer: Self-pay | Admitting: Internal Medicine

## 2011-05-13 DIAGNOSIS — M2550 Pain in unspecified joint: Secondary | ICD-10-CM

## 2011-05-13 DIAGNOSIS — R0609 Other forms of dyspnea: Secondary | ICD-10-CM

## 2011-05-13 DIAGNOSIS — M79641 Pain in right hand: Secondary | ICD-10-CM

## 2011-05-13 DIAGNOSIS — E785 Hyperlipidemia, unspecified: Secondary | ICD-10-CM

## 2011-05-13 DIAGNOSIS — R0989 Other specified symptoms and signs involving the circulatory and respiratory systems: Secondary | ICD-10-CM | POA: Insufficient documentation

## 2011-05-13 DIAGNOSIS — M25552 Pain in left hip: Secondary | ICD-10-CM | POA: Insufficient documentation

## 2011-05-13 DIAGNOSIS — M79609 Pain in unspecified limb: Secondary | ICD-10-CM

## 2011-05-13 DIAGNOSIS — M25549 Pain in joints of unspecified hand: Secondary | ICD-10-CM | POA: Insufficient documentation

## 2011-05-13 DIAGNOSIS — M25559 Pain in unspecified hip: Secondary | ICD-10-CM | POA: Insufficient documentation

## 2011-05-13 DIAGNOSIS — Z806 Family history of leukemia: Secondary | ICD-10-CM

## 2011-05-13 DIAGNOSIS — R5381 Other malaise: Secondary | ICD-10-CM

## 2011-05-13 DIAGNOSIS — R0600 Dyspnea, unspecified: Secondary | ICD-10-CM

## 2011-05-13 LAB — CBC WITH DIFFERENTIAL/PLATELET
Basophils Relative: 0 % (ref 0–1)
HCT: 47.7 % (ref 39.0–52.0)
Hemoglobin: 15.5 g/dL (ref 13.0–17.0)
Lymphs Abs: 2.2 10*3/uL (ref 0.7–4.0)
MCHC: 32.5 g/dL (ref 30.0–36.0)
MCV: 88.3 fL (ref 78.0–100.0)
Monocytes Absolute: 0.4 10*3/uL (ref 0.1–1.0)
Monocytes Relative: 6 % (ref 3–12)
Platelets: 215 10*3/uL (ref 150–400)

## 2011-05-13 NOTE — Progress Notes (Signed)
  Subjective:    Patient ID: Jorge Guerra, male    DOB: 09/19/1966, 45 y.o.   MRN: [id number]  HPI  45 y/o male for follow up re:   Hand pain and dyspnea.  Pt completed stress echo.  It was normal but he noticed significant left hip pain while walking / jogging on treadmill.  Walking or exertion seem to worsen left hip pain. He describes throbbing sensation.  Duration - 2 - 3 weeks.  Severity can be 8 out of 10 but currently mild  Pt tried stretching exercises.  It seems to help temporarily.  He did not use wrist splint as directed.  Pt worried that hip pain may be related to cancer.  His mother died of cancer and 2 brothers (1 recently) diagnosed with acute leukemia.    Review of Systems He denies wt loss or loss of appetite.    Past Medical History  Diagnosis Date  . Nephrolithiasis     Hx of microscopic hematuria.  Negative CT of abd and Pelvis for renal lesion  . Hyperlipidemia     History   Social History  . Marital Status: Married    Spouse Name: Melissa    Number of Children: 3  . Years of Education: N/A   Occupational History  . Development worker, community and [place]   Social History Main Topics  . Smoking status: Never Smoker   . Smokeless tobacco: Not on file  . Alcohol Use: Yes     rare, social  . Drug Use:   . Sexually Active:    Other Topics Concern  . 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  Family History  Problem Relation Age of Onset  . Leukemia Mother     CLL  . Fibromyalgia Mother   . COPD Father   . Diabetes Father     type II  . Diabetes Other   . Hypertension Other     No Known Allergies  Current Outpatient Prescriptions on File Prior to Visit  Medication Sig Dispense Refill  . rosuvastatin (CRESTOR) 10 MG tablet Take every other day        BP  116/80  Pulse 56  Temp(Src) 97.7 F (36.5 C) (Oral)  Resp 18  Wt 221 lb (100.245 kg)  SpO2 97%    Objective:   Physical Exam     Constitutional: Appears well-developed and well-nourished. No distress.  Neck: Normal range of motion. Neck supple. No thyromegaly present. No adenopathy Cardiovascular: Normal rate, regular rhythm and normal heart sounds.  Exam reveals no gallop and no friction rub.   No murmur heard. Pulmonary/Chest: Effort normal and breath sounds normal.  No wheezes. No rales.  Abdominal: Soft. Bowel sounds are normal. No mass. There is no tenderness.  abd obesity - difficult to assess for hepatosplenomegaly Neurological: Alert. No cranial nerve deficit.  Skin: Skin is warm and dry.  Psychiatric: Normal mood and affect. Behavior is normal.  Msk -  Mild discomfort with int rotation of left hip.  Tenderness near left trochanter Lymph - no adenopathy of axilla, or groin  Assessment & Plan:

## 2011-05-13 NOTE — Assessment & Plan Note (Signed)
Mild pain with int rotation.  Some tenderness near left trochanter.   Question DJD vs hip bursitis.  Arrange x ray then follow up with ortho. I doubt symptoms related to fam hx of cancer

## 2011-05-13 NOTE — Assessment & Plan Note (Signed)
Pt with multiple family members with leukemia.  Refer to Dr. Myna Hidalgo to see if any genetic testing warranted and opinion about future surveillance.

## 2011-05-13 NOTE — Patient Instructions (Signed)
Please use right wrist splint every night x 4 weeks as directed

## 2011-05-13 NOTE — Assessment & Plan Note (Signed)
Right hand pain.  I still think symptoms may before CTS.  Check x ray.   I doubt inflammatory process. Recent sed rate and uric acid level was normal

## 2011-05-13 NOTE — Assessment & Plan Note (Signed)
Cardiac stress testing negative. Check cxr.  Symptoms likely due to obesity / deconditioning.  Lab Results  Component Value Date   WBC 6.7 05/13/2011   HGB 15.5 05/13/2011   HCT 47.7 05/13/2011   MCV 88.3 05/13/2011   PLT 215 05/13/2011   Lab Results  Component Value Date   TSH 1.541 09/15/2010

## 2011-05-13 NOTE — Assessment & Plan Note (Signed)
Pt attributes start of joint pains to crestor.  He defers using statin for now.

## 2011-05-19 ENCOUNTER — Ambulatory Visit: Payer: Managed Care, Other (non HMO) | Admitting: Hematology & Oncology

## 2011-06-09 NOTE — Telephone Encounter (Signed)
Patient seen on 05/13/5011

## 2011-07-09 ENCOUNTER — Ambulatory Visit: Payer: Managed Care, Other (non HMO) | Admitting: Hematology & Oncology

## 2011-12-14 IMAGING — CR DG HAND 2V*R*
2 series · 2 of 2 positions shown · non-contrast
Comparison: None.

CLINICAL DATA: Chronic hand pain.

RIGHT HAND - 2 VIEW

[x hand pa right]
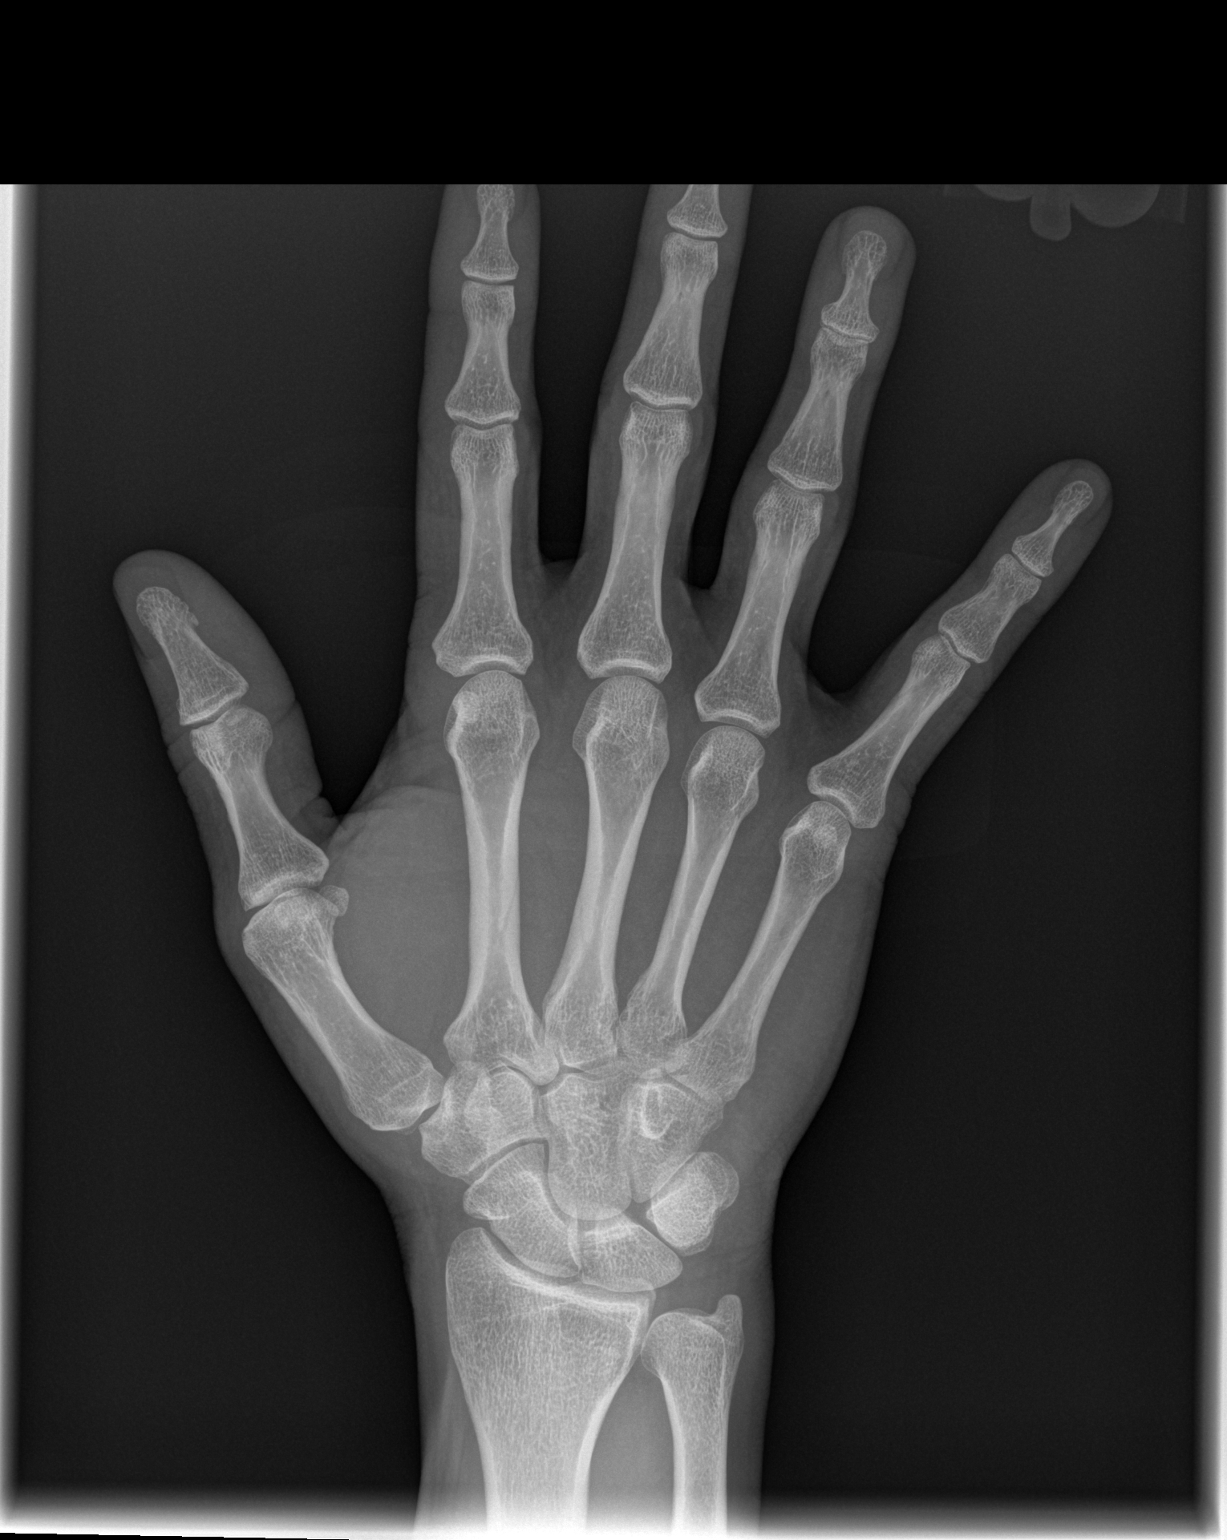

[x hand lat right]
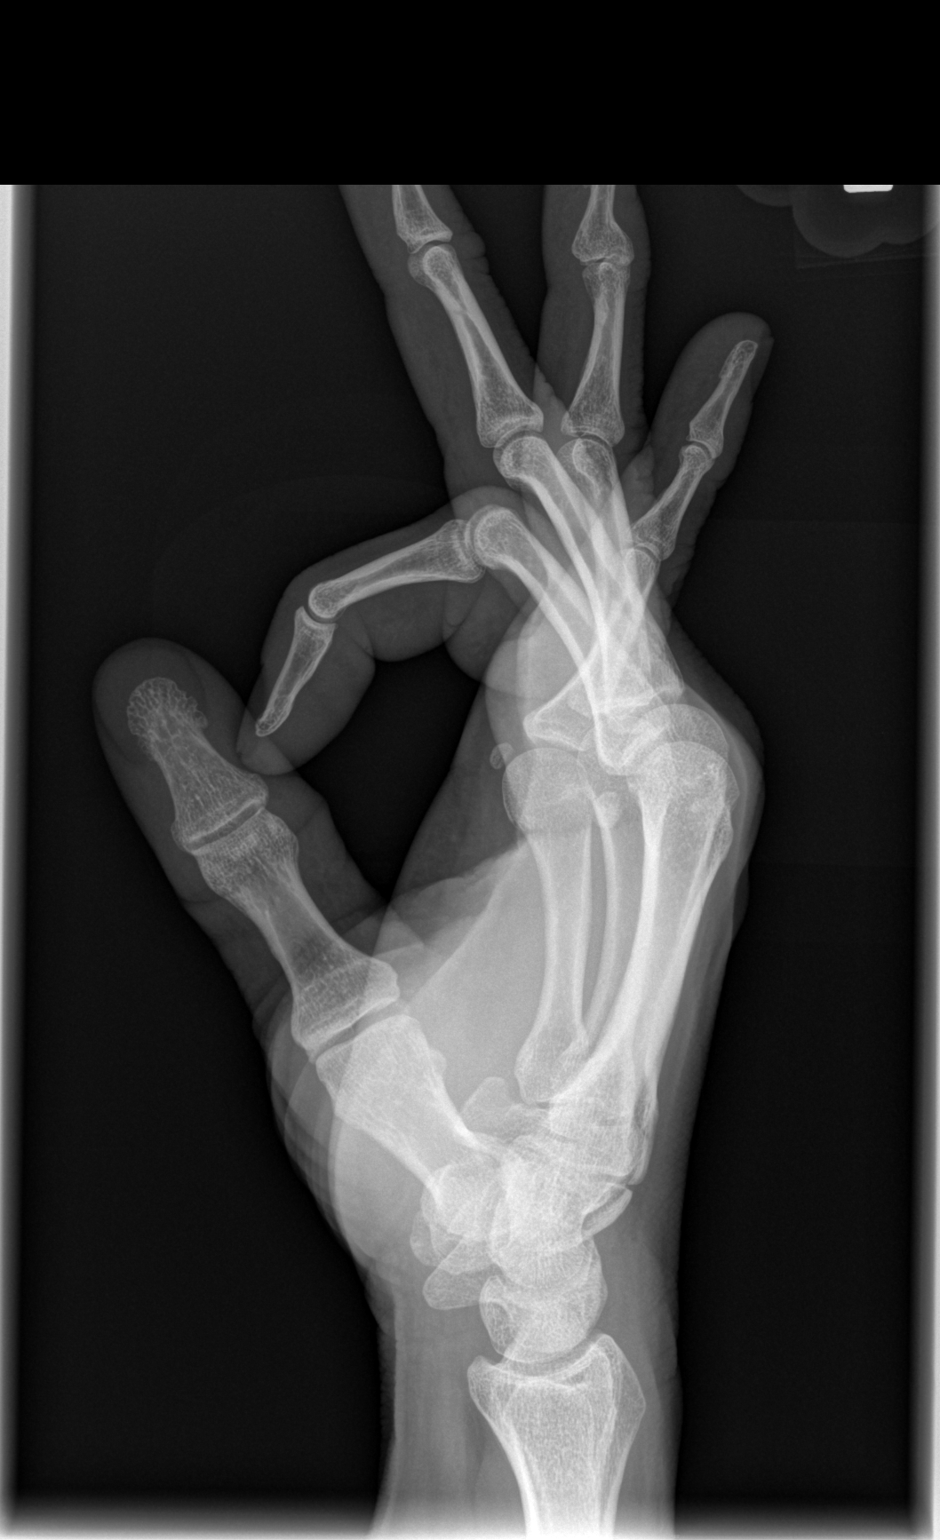

[2 of 2 positions shown; findings below may reference images not displayed]

FINDINGS: Imaged bones, joints and soft tissues appear normal.
IMPRESSION: Negative study.

## 2013-09-11 ENCOUNTER — Encounter: Payer: Self-pay | Admitting: Internal Medicine

## 2014-01-05 ENCOUNTER — Encounter: Payer: Self-pay | Admitting: Cardiology

## 2014-04-09 ENCOUNTER — Encounter: Payer: Self-pay | Admitting: Internal Medicine

## 2015-02-06 ENCOUNTER — Ambulatory Visit (AMBULATORY_SURGERY_CENTER): Payer: Self-pay

## 2015-02-06 VITALS — Ht 70.0 in | Wt 232.0 lb

## 2015-02-06 DIAGNOSIS — Z860100 Personal history of colon polyps, unspecified: Secondary | ICD-10-CM

## 2015-02-06 MED ORDER — MOVIPREP 100 G PO SOLR: 1.0000 | Freq: Once | ORAL | Status: DC

## 2015-02-06 NOTE — Progress Notes (Signed)
No allergies to eggs or soy No home oxygen No diet/weight loss meds No past problems with anesthesia  Has email  Emmi instructions given for colonoscopy 

## 2015-02-20 ENCOUNTER — Ambulatory Visit (AMBULATORY_SURGERY_CENTER): Payer: Managed Care, Other (non HMO) | Admitting: Internal Medicine

## 2015-02-20 ENCOUNTER — Encounter: Payer: Self-pay | Admitting: Internal Medicine

## 2015-02-20 VITALS — BP 101/68 | HR 60 | Temp 97.3°F | Resp 62 | Ht 70.0 in | Wt 232.0 lb

## 2015-02-20 DIAGNOSIS — Z86010 Personal history of colonic polyps: Secondary | ICD-10-CM

## 2015-02-20 MED ORDER — SODIUM CHLORIDE 0.9 % IV SOLN: 500.0000 mL | INTRAVENOUS | Status: DC

## 2015-02-20 NOTE — Progress Notes (Signed)
Report to PACU, RN, vss, BBS= Clear.  

## 2015-02-20 NOTE — Patient Instructions (Addendum)
  YOU HAD AN ENDOSCOPIC PROCEDURE TODAY AT [facility]:   Refer to the procedure report that was given to you for any specific questions about what was found during the examination.  If the procedure report does not answer your questions, please call your gastroenterologist to clarify.  If you requested that your care partner not be given the details of your procedure findings, then the procedure report has been included in a sealed envelope for you to review at your convenience later.  YOU SHOULD EXPECT: Some feelings of bloating in the abdomen. Passage of more gas than usual.  Walking can help get rid of the air that was put into your GI tract during the procedure and reduce the bloating. If you had a lower endoscopy (such as a colonoscopy or flexible sigmoidoscopy) you may notice spotting of blood in your stool or on the toilet paper. If you underwent a bowel prep for your procedure, you may not have a normal bowel movement for a few days.  Please Note:  You might notice some irritation and congestion in your nose or some drainage.  This is from the oxygen used during your procedure.  There is no need for concern and it should clear up in a day or so.  SYMPTOMS TO REPORT IMMEDIATELY:   Following lower endoscopy (colonoscopy or flexible sigmoidoscopy):  Excessive amounts of blood in the stool  Significant tenderness or worsening of abdominal pains  Swelling of the abdomen that is new, acute  Fever of 100F or higher   A gastroenterologist can be reached at any hour by calling [phone].   DIET: Your first meal following the procedure should be a small meal and then it is ok to progress to your normal diet. Heavy or fried foods are harder to digest and may make you feel nauseous or bloated.  Likewise, meals heavy in dairy and vegetables can increase bloating.  Drink plenty of fluids but you should avoid alcoholic beverages for 24 hours.  ACTIVITY:  You should plan to take  it easy for the rest of today and you should NOT DRIVE or use heavy machinery until tomorrow (because of the sedation medicines used during the test).    FOLLOW UP: Our staff will call the number listed on your records the next business day following your procedure to check on you and address any questions or concerns that you may have regarding the information given to you following your procedure. If we do not reach you, we will leave a message.  However, if you are feeling well and you are not experiencing any problems, there is no need to return our call.  We will assume that you have returned to your regular daily activities without incident.  If any biopsies were taken you will be contacted by phone or by letter within the next 1-3 weeks.  Please call us at [phone] if you have not heard about the biopsies in 3 weeks.    SIGNATURES/CONFIDENTIALITY: You and/or your care partner have signed paperwork which will be entered into your electronic medical record.  These signatures attest to the fact that that the information above on your After Visit Summary has been reviewed and is understood.  Full responsibility of the confidentiality of this discharge information lies with you and/or your care-partner.  Recommendations Discharge instructions given to patient and/or care partner. Next colonoscopy in 5 years.

## 2015-02-20 NOTE — Op Note (Signed)
[facility]  [address] [place], [address]   COLONOSCOPY PROCEDURE REPORT  PATIENT: Jorge Guerra, Jorge Guerra  MR#: [id number] BIRTHDATE: [birth year] , 48  yrs. old GENDER: male ENDOSCOPIST: Eustace Quail, MD REFERRED XB:[id number] Program Recall PROCEDURE DATE:  02/20/2015 PROCEDURE:   Colonoscopy, surveillance First Screening Colonoscopy - Avg.  risk and is 50 yrs.  old or older - No.  Prior Negative Screening - Now for repeat screening. N/A  History of Adenoma - Now for follow-up colonoscopy & has been > or = to 3 yrs.  Yes hx of adenoma.  Has been 3 or more years since last colonoscopy.  Polyps Removed Today? No.  Recommend repeat exam, <10 yrs? Yes.  Polyps Removed Today? No.  Recommend repeat exam, <10 yrs? Yes.  High risk (family or personal hx). ASA CLASS:   Class II INDICATIONS:follow up of adenomatous colonic polyp(s). Index exam November 2011 with advanced adenoma. MEDICATIONS: Monitored anesthesia care and Propofol 200 mg IV  DESCRIPTION OF PROCEDURE:   After the risks benefits and alternatives of the procedure were thoroughly explained, informed consent was obtained.  The digital rectal exam revealed no abnormalities of the rectum.   The LB [id number]  endoscope was introduced through the anus and advanced to the cecum, which was identified by both the appendix and ileocecal valve. No adverse events experienced.   The quality of the prep was excellent, using MoviPrep  The instrument was then slowly withdrawn as the colon was fully examined.    COLON FINDINGS: A normal appearing cecum, ileocecal valve, and appendiceal orifice were identified.  The ascending, transverse, descending, sigmoid colon, and rectum appeared unremarkable. Retroflexed views revealed internal hemorrhoids. The time to cecum=1 minutes 57 seconds.  Withdrawal time=8 minutes 17 seconds. The scope was withdrawn and the procedure completed. COMPLICATIONS: There were no  immediate complications.  ENDOSCOPIC IMPRESSION: 1. Normal colonoscopy  RECOMMENDATIONS: 1. Follow up colonoscopy in 5 years (advanced adenoma on index exam age 57)  eSigned:  Eustace Quail, MD 02/20/2015 11:00 AM   cc: The Patient and Kennith Maes MD

## 2015-02-21 ENCOUNTER — Telehealth: Payer: Self-pay | Admitting: *Deleted

## 2015-02-21 NOTE — Telephone Encounter (Signed)
Left message that we called for f/u 

## 2015-09-03 ENCOUNTER — Encounter: Payer: Self-pay | Admitting: Internal Medicine

## 2015-11-28 ENCOUNTER — Telehealth: Payer: Self-pay | Admitting: Internal Medicine

## 2015-11-29 NOTE — Telephone Encounter (Signed)
Left message for pt to call back. Pt scheduled to see Amy Esterwood PA 12/02/15@2 :30pm.

## 2015-11-29 NOTE — Telephone Encounter (Signed)
Patient returned phone call and he is aware of appointment.

## 2015-12-02 ENCOUNTER — Encounter: Payer: Self-pay | Admitting: Physician Assistant

## 2015-12-02 ENCOUNTER — Ambulatory Visit (INDEPENDENT_AMBULATORY_CARE_PROVIDER_SITE_OTHER): Payer: Managed Care, Other (non HMO) | Admitting: Physician Assistant

## 2015-12-02 ENCOUNTER — Other Ambulatory Visit (INDEPENDENT_AMBULATORY_CARE_PROVIDER_SITE_OTHER): Payer: Managed Care, Other (non HMO)

## 2015-12-02 VITALS — BP 128/78 | HR 80 | Ht 68.0 in | Wt 237.2 lb

## 2015-12-02 DIAGNOSIS — R1012 Left upper quadrant pain: Secondary | ICD-10-CM | POA: Diagnosis not present

## 2015-12-02 DIAGNOSIS — K219 Gastro-esophageal reflux disease without esophagitis: Secondary | ICD-10-CM

## 2015-12-02 DIAGNOSIS — R12 Heartburn: Secondary | ICD-10-CM

## 2015-12-02 LAB — BASIC METABOLIC PANEL
BUN: 14 mg/dL (ref 6–23)
CALCIUM: 10.6 mg/dL — AB (ref 8.4–10.5)
CO2: 31 mEq/L (ref 19–32)
Chloride: 104 mEq/L (ref 96–112)
Creatinine, Ser: 0.93 mg/dL (ref 0.40–1.50)
GFR: 90.84 mL/min (ref >60.00)
GLUCOSE: 94 mg/dL (ref 70–99)
Potassium: 4.3 mEq/L (ref 3.5–5.1)
Sodium: 141 mEq/L (ref 135–145)

## 2015-12-02 NOTE — Patient Instructions (Signed)
Please go to the basement level to have your labs drawn.  Continue the protonix 40 mg by mouth, 1 tab every morning. Add Pepcid 40 mg, 1 tab by mouth at bedtime.   You have been scheduled for a CT scan of the abdomen and pelvis at [facility], go to registration, just inside front door of hospital.  You are scheduled on Friday 12-06-2015 at 8:00 am . You should arrive at 7:45 am  to your appointment time for registration. Please follow the written instructions below on the day of your exam:  WARNING: IF YOU ARE ALLERGIC TO IODINE/X-RAY DYE, PLEASE NOTIFY RADIOLOGY IMMEDIATELY AT [phone]! YOU WILL BE GIVEN A 13 HOUR PREMEDICATION PREP.  1) Do not eat or drink anything after midnight. 2) You have been given 2 bottles of oral contrast to drink. The solution may taste better if refrigerated, but do NOT add ice or any other liquid to this solution. Shake well before drinking.    Drink 1 bottle of contrast @ 6:00 am  (2 hours prior to your exam)  Drink 1 bottle of contrast @ 7:00 am  (1 hour prior to your exam)  You may take any medications as prescribed with a small amount of water except for the following: Metformin, Glucophage, Glucovance, Avandamet, Riomet, Fortamet, Actoplus Met, Janumet, Glumetza or Metaglip. The above medications must be held the day of the exam AND 48 hours after the exam.  The purpose of you drinking the oral contrast is to aid in the visualization of your intestinal tract. The contrast solution may cause some diarrhea. Before your exam is started, you will be given a small amount of fluid to drink. Depending on your individual set of symptoms, you may also receive an intravenous injection of x-ray contrast/dye. Plan on being at [facility] for 30 minutes or long, depending on the type of exam you are having performed.  If you have any questions regarding your exam or if you need to reschedule, you may call the CT department at [phone] between the  hours of 8:00 am and 5:00 pm, Monday-Friday.  ________________________________________________________________________

## 2015-12-02 NOTE — Progress Notes (Signed)
Agree with initial assessment and plans as outlined 

## 2015-12-02 NOTE — Progress Notes (Signed)
Patient ID: Jorge Guerra, male   DOB: 03-13-1966, 49 y.o.   MRN: [id number]   Subjective:    Patient ID: Jorge Guerra, male    DOB: 11/12/1966, 49 y.o.   MRN: [id number]  HPI Jorge Guerra is a 49 year old white male known to Dr. Henrene Pastor. He has history of an advanced adenomatous polyp at age 39. He had follow-up colonoscopy March 2016 which was a normal exam. He is scheduled for 5 year interval follow-up. He comes in today complaining of burning in his esophagus as well as a left-sided abdominal pain which she says he has had for several months. He has diagnosis of hyperlipidemia and relates that since he was placed on WelChol he started noticing burning in his esophagus. He was placed on proton X which she says he took for a couple of months and this was helpful but as soon as he stopped the proton X his symptoms recurred. He was at that time taking this along with the WelChol. He is now stopped the WelChol continues proton X but says even with this he still having some nighttime symptoms which are making him uncomfortable. He says his appetite is fine he has been gaining weight. There is no dysphagia .  Second problem is the left-sided abdominal pain which he says is fairly constant does not radiate and is sharp in nature. Doesn't particularly describe any increase postprandially. His bowel movements have been normal. Says he is uncomfortable when he lies on his right side at night seems to increase his left-sided abdominal discomfort. He's also noticed a lot of gurgling and "noise" in his abdomen. He has had a prior umbilical hernia repair 2 and has mesh.   No regular aspirin or NSAIDs.  Review of Systems Pertinent positive and negative review of systems were noted in the above HPI section.  All other review of systems was otherwise negative.  Outpatient Encounter Prescriptions as of 12/02/2015  Medication Sig  . ALPRAZolam (XANAX) 0.5 MG tablet Take 0.5 mg by mouth at bedtime as needed for anxiety. Pt  usually only needs once monthly  . pantoprazole (PROTONIX) 40 MG tablet   . PRESCRIPTION MEDICATION Welcorm 1 packet daily for cholesterol (colescvelamHCL)   No facility-administered encounter medications on file as of 12/02/2015.   Allergies  Allergen Reactions  . Other     Steroids panic attacks  . Statins     Renal failure and elevated liver enzymes   Patient Active Problem List   Diagnosis Date Noted  . Left hip pain 05/13/2011  . Family history of leukemia 05/13/2011  . Dyspnea 04/27/2011  . Arthralgia 04/27/2011  . HYPERLIPIDEMIA 10/13/2010  . OBESITY 09/15/2010  . SKIN LESION 09/15/2010  . FATIGUE 09/15/2010  . OTHER SYMPTOMS INVOLVING DIGESTIVE SYSTEM OTHER 09/15/2010  . POLYURIA 09/15/2010  . NEPHROLITHIASIS, HX OF 09/15/2010   Social History   Social History  . Marital Status: Married    Spouse Name: Melissa  . Number of Children: 3  . Years of Education: N/A   Occupational History  . Herbalist and [employer] History Main Topics  . Smoking status: Never Smoker   . Smokeless tobacco: Never Used  . Alcohol Use: 0.0 oz/week    0 Standard drinks or equivalent per week     Comment: rare, social  . Drug Use: No  . Sexual Activity: Not on file   Other Topics Concern  . Not on file  Social History Narrative   Married 34 years-4th marriage   2 biological sons, 1 step son   1 biological daughter, 2 step daughters   (Mother Jerolyn Shin)    Mr. Schrantz family history includes COPD in his father; Diabetes in his father and other; Fibromyalgia in his mother; Heart disease in an other family member; Hypertension in his other; Leukemia in his brother, brother, and mother. There is no history of Colon cancer.      Objective:    Filed Vitals:   12/02/15 1422  BP: 128/78  Pulse: 80    Physical Exam  well-developed white male in no acute distress, blood pressure 128/78 pulse 80 height 5 foot 8 weight 237,  BMI 36. HEENT; nontraumatic normocephalic EOMI PERRLA sclera anicteric, Cardiovascular; regular rate and rhythm with S1-S2 no murmur or gallop, Pulmonary; clear bilaterally, Abdomen ;obese soft and mildly tender with deep palpation of the left upper quadrant no guarding or rebound, prior umbilical hernia repair, No palpable mass or hepatosplenomegaly, bowel sounds present, Rectal ;exam not done, Ext; no clubbing cyanosis or edema skin warm and dry, Neuropsych ;mood and affect appropriate       Assessment & Plan:   #1 49 yo male with GERD/esophagitis- ? Exacerbated by Welchol #2 LUQ pain x 4 months- etiology not clear #3 hx adenomatous polyps- due for f/u 2021  Plan; Schedule for CT adb/pelvis BMET Continue Protonix 40 mg po qam  Add Pepcid 40 mg po qhs Antireflux regimen  schedule for EGD with Dr Henrene Pastor - procedure discussed in detail with pt and he is agreeable to proceed.   Jorge Guerra Genia Harold PA-C 12/02/2015   Cc: Ronita Hipps, MD

## 2015-12-06 ENCOUNTER — Ambulatory Visit (HOSPITAL_COMMUNITY): Payer: Managed Care, Other (non HMO)

## 2015-12-18 ENCOUNTER — Encounter: Payer: Self-pay | Admitting: Internal Medicine

## 2015-12-18 ENCOUNTER — Ambulatory Visit (AMBULATORY_SURGERY_CENTER): Payer: Managed Care, Other (non HMO) | Admitting: Internal Medicine

## 2015-12-18 VITALS — BP 121/77 | HR 70 | Temp 97.0°F | Resp 22 | Ht 68.0 in | Wt 237.0 lb

## 2015-12-18 DIAGNOSIS — K2100 Gastro-esophageal reflux disease with esophagitis, without bleeding: Secondary | ICD-10-CM

## 2015-12-18 DIAGNOSIS — K219 Gastro-esophageal reflux disease without esophagitis: Secondary | ICD-10-CM

## 2015-12-18 DIAGNOSIS — R1012 Left upper quadrant pain: Secondary | ICD-10-CM | POA: Diagnosis not present

## 2015-12-18 MED ORDER — SODIUM CHLORIDE 0.9 % IV SOLN: 500.0000 mL | INTRAVENOUS | Status: DC

## 2015-12-18 NOTE — Op Note (Signed)
[facility]  [address] [place], [id number]   ENDOSCOPY PROCEDURE REPORT  PATIENT: Akshith, Agyeman  MR#: [id number] BIRTHDATE: 1966-03-09 , 49  yrs. old GENDER: male ENDOSCOPIST: Eustace Quail, MD REFERRED BY:  .  Self / Office PROCEDURE DATE:  12/18/2015 PROCEDURE:  EGD, diagnostic ASA CLASS:     Class II INDICATIONS:  history of esophageal reflux and abdominal pain in upper left quadrant. MEDICATIONS: Monitored anesthesia care and Propofol 150 mg IV TOPICAL ANESTHETIC: none  DESCRIPTION OF PROCEDURE: After the risks benefits and alternatives of the procedure were thoroughly explained, informed consent was obtained.  The LB [id number] endoscope was introduced through the mouth and advanced to the second portion of the duodenum , Without limitations.  The instrument was slowly withdrawn as the mucosa was fully examined.  EXAM:  The esophagus revealed distal esophagitis as manifested by edema of the mucosal Z line.  As well, there was evidence for partial large caliber stricture formation.  No Barrett's esophagus. Stomach was normal.  The duodenum was normal.  Retroflexed views revealed no abnormalities.     The scope was then withdrawn from the patient and the procedure completed.  COMPLICATIONS: There were no immediate complications.  ENDOSCOPIC IMPRESSION: The esophagus revealed distal esophagitis as manifested by edema of the mucosal Z line.  As well, there was evidence for partial large caliber stricture formation.  No Barrett's esophagus.  Stomach was normal.  The duodenum was normal  RECOMMENDATIONS: 1.  Anti-reflux regimen to be followed. Please see information provided by the nurse today 2.  Continue pantoprazole as needed to control reflux symptoms. Generally daily 3. Patient reports having had CT scan as ordered. No report found in Epic. Being investigated by GI head nurse  . Further recommendations if any pending outcome of CT  scan  REPEAT EXAM:  eSigned:  Eustace Quail, MD 12/18/2015 1:42 PM    CC:The Patient  ; Kennith Maes

## 2015-12-18 NOTE — Progress Notes (Signed)
Patient awakening,vss,report to rn 

## 2015-12-18 NOTE — Patient Instructions (Signed)
YOU HAD AN ENDOSCOPIC PROCEDURE TODAY AT [facility]:   Refer to the procedure report that was given to you for any specific questions about what was found during the examination.  If the procedure report does not answer your questions, please call your gastroenterologist to clarify.  If you requested that your care partner not be given the details of your procedure findings, then the procedure report has been included in a sealed envelope for you to review at your convenience later.  YOU SHOULD EXPECT: Some feelings of bloating in the abdomen. Passage of more gas than usual.  Walking can help get rid of the air that was put into your GI tract during the procedure and reduce the bloating. If you had a lower endoscopy (such as a colonoscopy or flexible sigmoidoscopy) you may notice spotting of blood in your stool or on the toilet paper. If you underwent a bowel prep for your procedure, you may not have a normal bowel movement for a few days.  Please Note:  You might notice some irritation and congestion in your nose or some drainage.  This is from the oxygen used during your procedure.  There is no need for concern and it should clear up in a day or so.  SYMPTOMS TO REPORT IMMEDIATELY:     Following upper endoscopy (EGD)  Vomiting of blood or coffee ground material  New chest pain or pain under the shoulder blades  Painful or persistently difficult swallowing  New shortness of breath  Fever of 100F or higher  Black, tarry-looking stools  For urgent or emergent issues, a gastroenterologist can be reached at any hour by calling [phone].   DIET: Your first meal following the procedure should be a small meal and then it is ok to progress to your normal diet. Heavy or fried foods are harder to digest and may make you feel nauseous or bloated.  Likewise, meals heavy in dairy and vegetables can increase bloating.  Drink plenty of fluids but you should avoid alcoholic beverages  for 24 hours.  ACTIVITY:  You should plan to take it easy for the rest of today and you should NOT DRIVE or use heavy machinery until tomorrow (because of the sedation medicines used during the test).    FOLLOW UP: Our staff will call the number listed on your records the next business day following your procedure to check on you and address any questions or concerns that you may have regarding the information given to you following your procedure. If we do not reach you, we will leave a message.  However, if you are feeling well and you are not experiencing any problems, there is no need to return our call.  We will assume that you have returned to your regular daily activities without incident.  If any biopsies were taken you will be contacted by phone or by letter within the next 1-3 weeks.  Please call us at [phone] if you have not heard about the biopsies in 3 weeks.    SIGNATURES/CONFIDENTIALITY: You and/or your care partner have signed paperwork which will be entered into your electronic medical record.  These signatures attest to the fact that that the information above on your After Visit Summary has been reviewed and is understood.  Full responsibility of the confidentiality of this discharge information lies with you and/or your care-partner.   Follow anti reflux regimen given to you today   Continue reflux medication ,Pantoprazole

## 2015-12-19 ENCOUNTER — Telehealth: Payer: Self-pay

## 2015-12-19 NOTE — Telephone Encounter (Signed)
Left a message on the pt's answering machine for him to call us back if any questions or concerns. maw

## 2015-12-27 ENCOUNTER — Encounter: Payer: Self-pay | Admitting: Internal Medicine

## 2019-10-15 ENCOUNTER — Other Ambulatory Visit: Payer: Self-pay

## 2019-10-15 ENCOUNTER — Emergency Department (HOSPITAL_BASED_OUTPATIENT_CLINIC_OR_DEPARTMENT_OTHER)
Admission: EM | Admit: 2019-10-15 | Discharge: 2019-10-15 | Disposition: A | Discharge status: Home / Self Care | Payer: 59 | Attending: Emergency Medicine | Admitting: Emergency Medicine

## 2019-10-15 ENCOUNTER — Encounter (HOSPITAL_BASED_OUTPATIENT_CLINIC_OR_DEPARTMENT_OTHER): Payer: Self-pay | Admitting: Emergency Medicine

## 2019-10-15 ENCOUNTER — Emergency Department (HOSPITAL_BASED_OUTPATIENT_CLINIC_OR_DEPARTMENT_OTHER): Payer: 59

## 2019-10-15 DIAGNOSIS — Z79899 Other long term (current) drug therapy: Secondary | ICD-10-CM | POA: Insufficient documentation

## 2019-10-15 DIAGNOSIS — K5900 Constipation, unspecified: Secondary | ICD-10-CM | POA: Diagnosis not present

## 2019-10-15 DIAGNOSIS — R1084 Generalized abdominal pain: Secondary | ICD-10-CM | POA: Diagnosis not present

## 2019-10-15 DIAGNOSIS — K219 Gastro-esophageal reflux disease without esophagitis: Secondary | ICD-10-CM | POA: Diagnosis not present

## 2019-10-15 DIAGNOSIS — R109 Unspecified abdominal pain: Secondary | ICD-10-CM | POA: Diagnosis present

## 2019-10-15 LAB — COMPREHENSIVE METABOLIC PANEL
ALT: 17 U/L (ref 0–44)
AST: 16 U/L (ref 15–41)
Albumin: 4.2 g/dL (ref 3.5–5.0)
Alkaline Phosphatase: 70 U/L (ref 38–126)
Anion gap: 10 (ref 5–15)
BUN: 14 mg/dL (ref 6–20)
CO2: 24 mmol/L (ref 22–32)
Calcium: 10.2 mg/dL (ref 8.9–10.3)
Chloride: 105 mmol/L (ref 98–111)
Creatinine, Ser: 0.95 mg/dL (ref 0.61–1.24)
GFR calc Af Amer: >60 mL/min (ref >60)
GFR calc non Af Amer: >60 mL/min (ref >60)
Glucose, Bld: 100 mg/dL — ABNORMAL HIGH (ref 70–99)
Potassium: 3.7 mmol/L (ref 3.5–5.1)
Sodium: 139 mmol/L (ref 135–145)
Total Bilirubin: 2.5 mg/dL — ABNORMAL HIGH (ref 0.3–1.2)
Total Protein: 7 g/dL (ref 6.5–8.1)

## 2019-10-15 LAB — URINALYSIS, MICROSCOPIC (REFLEX)

## 2019-10-15 LAB — URINALYSIS, ROUTINE W REFLEX MICROSCOPIC
Glucose, UA: NEGATIVE mg/dL
Ketones, ur: 40 mg/dL — AB
Leukocytes,Ua: NEGATIVE
Nitrite: NEGATIVE
Protein, ur: NEGATIVE mg/dL
Specific Gravity, Urine: 1.02 (ref 1.005–1.030)
pH: 6 (ref 5.0–8.0)

## 2019-10-15 LAB — CBC
HCT: 49.8 % (ref 39.0–52.0)
Hemoglobin: 16.7 g/dL (ref 13.0–17.0)
MCH: 29.6 pg (ref 26.0–34.0)
MCHC: 33.5 g/dL (ref 30.0–36.0)
MCV: 88.3 fL (ref 80.0–100.0)
Platelets: 238 10*3/uL (ref 150–400)
RBC: 5.64 MIL/uL (ref 4.22–5.81)
RDW: 12.1 % (ref 11.5–15.5)
WBC: 8.3 10*3/uL (ref 4.0–10.5)
nRBC: 0 % (ref 0.0–0.2)

## 2019-10-15 LAB — LIPASE, BLOOD: Lipase: 26 U/L (ref 11–51)

## 2019-10-15 MED ORDER — FLEET ENEMA 7-19 GM/118ML RE ENEM
1.0000 | ENEMA | Freq: Once | RECTAL | Status: AC
  Administered 2019-10-15: 13:00:00 1 via RECTAL
  Filled 2019-10-15: qty 1

## 2019-10-15 MED ORDER — IOHEXOL 300 MG/ML  SOLN
100.0000 mL | Freq: Once | INTRAMUSCULAR | Status: AC | PRN
  Administered 2019-10-15: 12:00:00 100 mL via INTRAVENOUS

## 2019-10-15 MED ORDER — MORPHINE SULFATE (PF) 4 MG/ML IV SOLN: 4.0000 mg | Freq: Once | INTRAVENOUS | Status: DC

## 2019-10-15 MED ORDER — ONDANSETRON HCL 4 MG/2ML IJ SOLN: 4.0000 mg | Freq: Once | INTRAMUSCULAR | Status: DC

## 2019-10-15 MED ORDER — SODIUM CHLORIDE 0.9 % IV BOLUS
1000.0000 mL | Freq: Once | INTRAVENOUS | Status: AC
  Administered 2019-10-15: 11:00:00 1000 mL via INTRAVENOUS

## 2019-10-15 NOTE — ED Notes (Signed)
Patient transported to CT 

## 2019-10-15 NOTE — Discharge Instructions (Signed)
May take 8 scoops of Miralax in 36 oz of liquid of choice. May repeat every 6 hours as needed for constipation.  Follow up with the GI group.

## 2019-10-15 NOTE — ED Triage Notes (Addendum)
Pt reports abd pain described as burning x 1 week after eating. Seen by PCP and referred to GI and given meds without relief. Also endorses constipation

## 2019-10-15 NOTE — ED Provider Notes (Signed)
[facility] Provider Note   CSN: [id number] Arrival date & time: 10/15/19  [id number]    History   Chief Complaint Chief Complaint  Patient presents with  . Abdominal Pain   HPI Jorge Guerra is a 53 y.o. male with medical history significant for nephrolithiasis, anxiety, hyperlipidemia who presents for evaluation of abdominal pain.  Patient states he had generalized abdominal pain however worse the left lower quadrant.  He has been constipated over the last 3 weeks.  Patient also with epigastric burning sensation.  He was seen by his PCP who started him on PPI and follow-up with GI.  Patient states his lower abdominal pain is worse with food intake and therefore has had a decreased appetite.  Denies fever, chills, nausea, vomiting, chest pain, shortness of breath, dysuria.  Patient states he is able to have very minimal bowel movements which are hard.  No melena or hematochezia.  Patient states he had a recent image 10 however is not sure if this was an x-ray.  He has not taken anything for symptoms.  Denies additional aggravating or alleviating factors.  Rates his current pain a 7/10.   History obtained from patient and past medical records.  No interpreter is used.     HPI  Past Medical History:  Diagnosis Date  . Anxiety    situational related to events and work-related  . Hyperlipidemia   . Nephrolithiasis    Hx of microscopic hematuria.  Negative CT of abd and Pelvis for renal lesion    Patient Active Problem List   Diagnosis Date Noted  . Left hip pain 05/13/2011  . Family history of leukemia 05/13/2011  . Dyspnea 04/27/2011  . Arthralgia 04/27/2011  . HYPERLIPIDEMIA 10/13/2010  . OBESITY 09/15/2010  . SKIN LESION 09/15/2010  . FATIGUE 09/15/2010  . OTHER SYMPTOMS INVOLVING DIGESTIVE SYSTEM OTHER 09/15/2010  . POLYURIA 09/15/2010  . NEPHROLITHIASIS, HX OF 09/15/2010    Past Surgical History:  Procedure Laterality Date  . COLONOSCOPY     . POLYPECTOMY     colon polyps  . TONSILLECTOMY     age 45  . UMBILICAL HERNIA REPAIR     1990's mesh placed  . UMBILICAL HERNIA REPAIR     2005 mesh broke ; has gortex patch        Home Medications    Prior to Admission medications   Medication Sig Start Date End Date Taking? Authorizing Provider  ALPRAZolam Duanne Moron) 0.5 MG tablet Take 0.5 mg by mouth at bedtime as needed for anxiety. Pt usually only needs once monthly    [provider]  pantoprazole (PROTONIX) 40 MG tablet  11/26/15   [provider]  PRESCRIPTION MEDICATION Welcorm 1 packet daily for cholesterol (colescvelamHCL)    [provider]    Family History Family History  Problem Relation Age of Onset  . Leukemia Mother        CLL  . Fibromyalgia Mother   . COPD Father   . Diabetes Father        type II  . Diabetes Other   . Hypertension Other   . Leukemia Brother        2 step brothers  . Leukemia Brother        2 step brothers  . Heart disease Other   . Colon cancer Neg Hx   . Esophageal cancer Neg Hx   . Stomach cancer Neg Hx   . Rectal cancer Neg Hx  Social History Social History   Tobacco Use  . Smoking status: Never Smoker  . Smokeless tobacco: Never Used  Substance Use Topics  . Alcohol use: Yes    Alcohol/week: 0.0 standard drinks    Comment: rare  . Drug use: No   Allergies   Other and Statins  Review of Systems Review of Systems  Constitutional: Negative.   HENT: Negative.   Respiratory: Negative.   Cardiovascular: Negative.   Gastrointestinal: Positive for abdominal pain, constipation and nausea. Negative for abdominal distention, anal bleeding, blood in stool, diarrhea, rectal pain and vomiting.  Genitourinary: Negative.   Musculoskeletal: Negative.   Skin: Negative.   Neurological: Negative.   All other systems reviewed and are negative.  Physical Exam Updated Vital Signs BP (!) 144/84   Pulse 72   Temp 98.1 F (36.7 C) (Oral)   Resp  19   Ht 5\' 10"  (1.778 m)   Wt 97.1 kg   SpO2 100%   BMI 30.71 kg/m   Physical Exam Vitals signs and nursing note reviewed. Exam conducted with a chaperone present.  Constitutional:      General: He is not in acute distress.    Appearance: He is well-developed. He is not ill-appearing, toxic-appearing or diaphoretic.  HENT:     Head: Normocephalic and atraumatic.     Mouth/Throat:     Mouth: Mucous membranes are moist.     Pharynx: Oropharynx is clear.  Eyes:     Pupils: Pupils are equal, round, and reactive to light.  Neck:     Musculoskeletal: Normal range of motion and neck supple.  Cardiovascular:     Rate and Rhythm: Normal rate and regular rhythm.     Heart sounds: Normal heart sounds.  Pulmonary:     Effort: Pulmonary effort is normal. No respiratory distress.  Abdominal:     General: Bowel sounds are normal. There is no distension.     Palpations: Abdomen is soft.     Tenderness: There is generalized abdominal tenderness and tenderness in the suprapubic area and left lower quadrant. There is no right CVA tenderness, left CVA tenderness, guarding or rebound.     Comments: Soft.  Generalized tenderness however worse at left lower quadrant and suprapubic region.  No rebound or guarding.  Normoactive bowel sounds.  Genitourinary:    Penis: Normal.      Scrotum/Testes: Normal.     Epididymis:     Right: Normal.     Left: Normal.     Rectum: Normal.     Comments: Old hemorrhoid skin tags.  Good rectal tone.  No anal fissure.  No stool in rectal vault. Musculoskeletal: Normal range of motion.     Comments: Moves all 4 extremities without difficulty.  Skin:    General: Skin is warm and dry.     Comments: Brisk capillary refill.  Neurological:     Mental Status: He is alert.    ED Treatments / Results  Labs (all labs ordered are listed, but only abnormal results are displayed) Labs Reviewed  COMPREHENSIVE METABOLIC PANEL - Abnormal; Notable for the following  components:      Result Value   Glucose, Bld 100 (*)    Total Bilirubin 2.5 (*)    All other components within normal limits  URINALYSIS, ROUTINE W REFLEX MICROSCOPIC - Abnormal; Notable for the following components:   Hgb urine dipstick TRACE (*)    Bilirubin Urine SMALL (*)    Ketones, ur 40 (*)  All other components within normal limits  URINALYSIS, MICROSCOPIC (REFLEX) - Abnormal; Notable for the following components:   Bacteria, UA FEW (*)    All other components within normal limits  LIPASE, BLOOD  CBC    EKG EKG Interpretation  Date/Time:  Sunday October 15 2019 10:33:06 EDT Ventricular Rate:  74 PR Interval:    QRS Duration: 83 QT Interval:  482 QTC Calculation: 535 R Axis:   84 Text Interpretation:  Sinus rhythm Borderline T abnormalities, lateral leads no acute ischemic appearance. no old comparison Confirmed by Charlesetta Shanks [phone]) on 10/15/2019 2:27:31 PM   Radiology Ct Abdomen Pelvis W Contrast  Result Date: 10/15/2019 CLINICAL DATA:  Abdominal pain.  Burning for 1 week after eating EXAM: CT ABDOMEN AND PELVIS WITH CONTRAST TECHNIQUE: Multidetector CT imaging of the abdomen and pelvis was performed using the standard protocol following bolus administration of intravenous contrast. CONTRAST:  117mL OMNIPAQUE IOHEXOL 300 MG/ML  SOLN COMPARISON:  02/20/2019 FINDINGS: Lower chest: No acute abnormality. Hepatobiliary: No focal liver abnormality is seen. No gallstones, gallbladder wall thickening, or biliary dilatation. Pancreas: Unremarkable. No pancreatic ductal dilatation or surrounding inflammatory changes. Spleen: Normal in size without focal abnormality. Adrenals/Urinary Tract: Adrenal glands are unremarkable. Kidneys are normal, without renal calculi, focal lesion, or hydronephrosis. Bladder is unremarkable. Stomach/Bowel: Stomach is within normal limits. Appendix appears normal. No evidence of bowel wall thickening, distention, or inflammatory changes.  Diverticulosis without evidence of diverticulitis. Vascular/Lymphatic: No significant vascular findings are present. No enlarged abdominal or pelvic lymph nodes. Reproductive: Prostate is unremarkable. Other: Fat containing right inguinal hernia. Prior ventral abdominal hernia repair with a mesh in place. No ascites. Musculoskeletal: No acute osseous abnormality. No aggressive osseous lesion. IMPRESSION: 1. No acute abdominal or pelvic pathology. 2. Normal appendix. 3. Diverticulosis without evidence of diverticulitis. 4. No urolithiasis or obstructive uropathy. Electronically Signed   By: Kathreen Devoid   On: 10/15/2019 12:25    Procedures Procedures (including critical care time)  Medications Ordered in ED Medications  morphine 4 MG/ML injection 4 mg (4 mg Intravenous Refused 10/15/19 1031)  sodium chloride 0.9 % bolus 1,000 mL (0 mLs Intravenous Stopped 10/15/19 1207)  iohexol (OMNIPAQUE) 300 MG/ML solution 100 mL (100 mLs Intravenous Contrast Given 10/15/19 1136)  sodium phosphate (FLEET) 7-19 GM/118ML enema 1 enema (1 enema Rectal Given 10/15/19 1250)    Initial Impression / Assessment and Plan / ED Course  I have reviewed the triage vital signs and the nursing notes.  Pertinent labs & imaging results that were available during my care of the patient were reviewed by me and considered in my medical decision making (see chart for details).  53 year old male appears otherwise well presents for evaluation of generalized abdominal pain and constipation.  He also has some reflux type symptom sx for months.  Was recently started on PPI by his PCP and referred to GI.  He has no chest pain, shortness of breath.  Abdomen with diffuse tenderness however worse to left lower quadrant.  Heart and lungs clear.  Mucous membranes moist.  Will provide pain management, antimedics, labs, CT scan. EKG without ST.T changes. No STEMI.  Labs and personally reviewed and interpreted. No acute findings  Evaluation  patient refused pain medicine, nausea medicine, GI cocktail.  Continue benign abdominal exam.  Non-surgical abd. CT abdomen pelvis without any acute findings.  Attempted manual disimpaction however patient does not have a stool in his rectal vault.  No severe constipation on CT scan.  Patient requesting fleets  enema.  Will order.  Also offered IV Protonix which patient declined for his reflux.  He has no chest pain, shortness of breath.  His EKG is without ST/T changes.  I have low suspicion for atypical ACS.  Discussed with patient possible H. pylori as cause of his symptoms and offered to treat symptomatically however patient declined.  Patient states he has had upper endoscopies and colonoscopies recently however they did not find any findings per patient for his chronic reflux.  Small bowel movement with enema. Patient voices some relief. Will dc home with bowel regimen and close GI follow up. He does not want to be started on any additional medications for his reflux.  Patient is nontoxic, nonseptic appearing, in no apparent distress.  Patient's pain and other symptoms adequately managed in emergency department.  Fluid bolus given.  Labs, imaging and vitals reviewed.  Patient does not meet the SIRS or Sepsis criteria.  On repeat exam patient does not have a surgical abdomin and there are no peritoneal signs.  No indication of appendicitis, bowel obstruction, bowel perforation, cholecystitis, diverticulitis.  Patient discharged home with symptomatic treatment and given strict instructions for follow-up with their primary care physician.  I have also discussed reasons to return immediately to the ER.  Patient expresses understanding and agrees with plan.      Final Clinical Impressions(s) / ED Diagnoses   Final diagnoses:  Generalized abdominal pain  Gastroesophageal reflux disease, unspecified whether esophagitis present  Constipation, unspecified constipation type    ED Discharge Orders    None        Jaquisha Frech A, PA-C 10/15/19 1429    Charlesetta Shanks, MD 10/16/19 [phone]

## 2019-10-15 NOTE — ED Notes (Addendum)
Denies dysuria, states, '" I can't eat or drink because my stomach hurts" "Everything hurts" . Unsure of last BM, ? 1 week ago

## 2019-10-15 NOTE — ED Notes (Signed)
Bedside commode at bedside. Pt verbalized understanding to hold enema solution until unable to hold any longer

## 2019-10-26 ENCOUNTER — Ambulatory Visit: Payer: 59 | Admitting: Physician Assistant

## 2019-10-26 ENCOUNTER — Encounter: Payer: Self-pay | Admitting: Physician Assistant

## 2019-10-26 ENCOUNTER — Telehealth: Payer: Self-pay

## 2019-10-26 VITALS — BP 121/70 | HR 87 | Temp 97.0°F | Ht 70.0 in | Wt 210.0 lb

## 2019-10-26 DIAGNOSIS — R1013 Epigastric pain: Secondary | ICD-10-CM

## 2019-10-26 DIAGNOSIS — Z86010 Personal history of colonic polyps: Secondary | ICD-10-CM

## 2019-10-26 DIAGNOSIS — R634 Abnormal weight loss: Secondary | ICD-10-CM

## 2019-10-26 DIAGNOSIS — K5900 Constipation, unspecified: Secondary | ICD-10-CM | POA: Diagnosis not present

## 2019-10-26 DIAGNOSIS — K219 Gastro-esophageal reflux disease without esophagitis: Secondary | ICD-10-CM

## 2019-10-26 NOTE — Telephone Encounter (Signed)
Called patient and let him know Ellouise Newer PA received his records from his PCP and there is no additional information from what they had talked about today in the office. To continue doing what hie is doing and call us if symptoms or pain increase

## 2019-10-26 NOTE — Progress Notes (Signed)
Assessment and plan as noted 

## 2019-10-26 NOTE — Telephone Encounter (Signed)
-----   Message from Levin Erp, [place] sent at 10/26/2019  2:44 PM EST ----- Regarding: Records Please call the patient and let him know I received records from his PCP.  There was no other information in here other than what we discussed today at his office visit.  Again would recommend that he continue what he is doing for now, if he has an increase in symptoms or pain he can give [place] a call.  Thanks, Ellouise Newer, PA-C

## 2019-10-26 NOTE — Progress Notes (Addendum)
Chief Complaint: Abdominal pain and weight loss  HPI:    Jorge Guerra is a 53 year old male with a past medical history as listed below, known to Dr. Henrene Pastor, who presents clinic today with a complaint of abdominal pain and weight loss.    02/20/2015 colonoscopy for follow-up of adenomatous polyps in November 2011, no polyps on that exam, repeat recommended in 5 years.    12/02/2015 patient seen in clinic by [facility] for burning in his esophagus and left-sided abdominal pain.  At that time scheduled for CT abdomen pelvis as well as labs and continued on Protonix 40 mg daily add Pepcid 40 mg nightly.  Scheduled for EGD.    12/18/2015 EGD with distal esophagitis and partial large caliber stricture.    03/03/2017 seen at [facility] gastroenterology for his reflux.  At that time was recommended he have an EGD.    03/10/2017 pathology from EGD was normal.    10/15/2019 seen in the ER for abdominal pain.  At that time describe generalized abdominal pain worse in the left lower quadrant with constipation over the past 3 weeks.  CBC normal.  CMP with minimally elevated total bilirubin at 2.5.  Lipase normal.  CT abdomen pelvis showed no acute abdominal or pelvic pathology, normal appendix, diverticulosis without evidence of diverticulitis and no urolithiasis or obstructive uropathy.  That time manual disimpaction was attempted but there was no stool in his rectal vault.  Patient requested fleets enema which gave him a small bowel movement.    Today, it should be noted patient is a poor historian, it is hard to follow him. Explains that over the past 3 weeks he had had severe epigastric abdominal pain with uncontrollable reflux symptoms.  Over the past 3 months he has lost around 20 pounds, but tells me that he was not really been eating due to severe epigastric pain and reflux symptoms as well as an increase in stress and anxiety lately.  Tells me he has a long history of "burning in my esophagus", this seems  to come and go.  Thought related to anxiety at one time for which he was started on Xanax.  Tells me he was on Zantac in the past as well as sucralfate at times, and his pain would seem to go away.  Over the past 3 weeks had to even sleep in a chair to control his symptoms of reflux.  Most recently over the past week or so he has been feeling much better.  He has no further symptoms.  He is only using Esomeprazole 20 mg as needed for breakthrough symptoms.  He has been able to eat more regularly over the past few days.  Denies continue abdominal pain.    Briefly discussed constipation, patient tells me he is now having a daily normal bowel movement.    Denies fever, chills or blood in the stool.  Past Medical History:  Diagnosis Date  . Anxiety    situational related to events and work-related  . Hyperlipidemia   . Nephrolithiasis    Hx of microscopic hematuria.  Negative CT of abd and Pelvis for renal lesion    Past Surgical History:  Procedure Laterality Date  . COLONOSCOPY    . POLYPECTOMY     colon polyps  . TONSILLECTOMY     age 82  . UMBILICAL HERNIA REPAIR     1990's mesh placed  . UMBILICAL HERNIA REPAIR     2005 mesh broke ; has gortex patch  Current Outpatient Medications  Medication Sig Dispense Refill  . ALPRAZolam (XANAX) 0.5 MG tablet Take 0.5 mg by mouth at bedtime as needed for anxiety. Pt usually only needs once monthly    . pantoprazole (PROTONIX) 40 MG tablet     . PRESCRIPTION MEDICATION Welcorm 1 packet daily for cholesterol (colescvelamHCL)     No current facility-administered medications for this visit.     Allergies as of 10/26/2019 - Review Complete 10/15/2019  Allergen Reaction Noted  . Other  02/06/2015  . Statins  02/06/2015    Family History  Problem Relation Age of Onset  . Leukemia Mother        CLL  . Fibromyalgia Mother   . COPD Father   . Diabetes Father        type II  . Diabetes Other   . Hypertension Other   . Leukemia Brother         2 step brothers  . Leukemia Brother        2 step brothers  . Heart disease Other   . Colon cancer Neg Hx   . Esophageal cancer Neg Hx   . Stomach cancer Neg Hx   . Rectal cancer Neg Hx     Social History   Socioeconomic History  . Marital status: Married    Spouse name: Melissa  . Number of children: 3  . Years of education: Not on file  . Highest education level: Not on file  Occupational History  . Occupation: Tax inspector    Comment: Occupational hygienist  Social Needs  . Financial resource strain: Not on file  . Food insecurity    Worry: Not on file    Inability: Not on file  . Transportation needs    Medical: Not on file    Non-medical: Not on file  Tobacco Use  . Smoking status: Never Smoker  . Smokeless tobacco: Never Used  Substance and Sexual Activity  . Alcohol use: Yes    Alcohol/week: 0.0 standard drinks    Comment: rare  . Drug use: No  . Sexual activity: Not on file  Lifestyle  . Physical activity    Days per week: Not on file    Minutes per session: Not on file  . Stress: Not on file  Relationships  . Social Herbalist on phone: Not on file    Gets together: Not on file    Attends religious service: Not on file    Active member of club or organization: Not on file    Attends meetings of clubs or organizations: Not on file    Relationship status: Not on file  . Intimate partner violence    Fear of current or ex partner: Not on file    Emotionally abused: Not on file    Physically abused: Not on file    Forced sexual activity: Not on file  Other Topics Concern  . Not on file  Social History Narrative   Married 40 years-4th marriage   2 biological sons, 1 step son   1 biological daughter, 2 step daughters   (Mother Jerolyn Shin)    Review of Systems:    Constitutional: No fever or chills Skin: No rash  Cardiovascular: No chest pain  Respiratory: No SOB  Gastrointestinal: See HPI and otherwise  negative Genitourinary: No dysuria Neurological: No headache, dizziness or syncope Musculoskeletal: No new muscle or joint pain Hematologic: No bleeding or bruising Psychiatric: +anxiety  Physical Exam:  Vital signs: BP 121/70   Pulse 87   Temp (!) 97 F (36.1 C)   Ht 5\' 10"  (1.778 m)   Wt 210 lb (95.3 kg)   SpO2 98%   BMI 30.13 kg/m   Constitutional:   Pleasant Caucasian male appears to be in NAD, Well developed, Well nourished, alert and cooperative Head:  Normocephalic and atraumatic. Eyes:   PEERL, EOMI. No icterus. Conjunctiva pink. Ears:  Normal auditory acuity. Neck:  Supple Throat: Oral cavity and pharynx without inflammation, swelling or lesion.  Respiratory: Respirations even and unlabored. Lungs clear to auscultation bilaterally.   No wheezes, crackles, or rhonchi.  Cardiovascular: Normal S1, S2. No MRG. Regular rate and rhythm. No peripheral edema, cyanosis or pallor.  Gastrointestinal:  Soft, nondistended, nontender. No rebound or guarding. Normal bowel sounds. No appreciable masses or hepatomegaly. Rectal:  Not performed.  Msk:  Symmetrical without gross deformities. Without edema, no deformity or joint abnormality.  Neurologic:  Alert and  oriented x4;  grossly normal neurologically.  Skin:   Dry and intact without significant lesions or rashes. Psychiatric: Demonstrates good judgement and reason without abnormal affect or behaviors.  RELEVANT LABS AND IMAGING: CBC    Component Value Date/Time   WBC 8.3 10/15/2019 1025   RBC 5.64 10/15/2019 1025   HGB 16.7 10/15/2019 1025   HCT 49.8 10/15/2019 1025   PLT 238 10/15/2019 1025   MCV 88.3 10/15/2019 1025   MCH 29.6 10/15/2019 1025   MCHC 33.5 10/15/2019 1025   RDW 12.1 10/15/2019 1025   LYMPHSABS 2.2 05/13/2011 1009   MONOABS 0.4 05/13/2011 1009   EOSABS 0.1 05/13/2011 1009   BASOSABS 0.0 05/13/2011 1009    CMP     Component Value Date/Time   NA 139 10/15/2019 1025   K 3.7 10/15/2019 1025   CL 105  10/15/2019 1025   CO2 24 10/15/2019 1025   GLUCOSE 100 (H) 10/15/2019 1025   BUN 14 10/15/2019 1025   CREATININE 0.95 10/15/2019 1025   CREATININE 0.93 04/10/2011 1110   CALCIUM 10.2 10/15/2019 1025   PROT 7.0 10/15/2019 1025   ALBUMIN 4.2 10/15/2019 1025   AST 16 10/15/2019 1025   ALT 17 10/15/2019 1025   ALKPHOS 70 10/15/2019 1025   BILITOT 2.5 (H) 10/15/2019 1025   GFRNONAA >60 10/15/2019 1025   GFRNONAA >60 04/10/2011 1110   GFRAA >60 10/15/2019 1025   GFRAA >60 04/10/2011 1110    Assessment: 1.  Epigastric pain: With below 2.  GERD: Intermittent symptoms over the past 10 years, some relation to anxiety and stress, most recently better over the past week 3.  Constipation: Found in the ER, now relieved 4.  History of adenomatous polyps: Last colonoscopy in 2016 with recommendations to repeat in 5 years 5.  Weight loss: Likely related to decrease in appetite/eating due to epigastric pain and reflux  Plan: 1.  At time of exam today patient is feeling better.  He does not really know what we are supposed to do for him, but notes that he was referred here by his PCP.  We do not have records at time of his exam.  Requested records including referral from his PCP. 2.  Patient is due for his next colonoscopy in March 2016.  Ensured he was in recall for this. 3.  At this time would recommend patient continue his current diet and continue Esomeprazole 20 mg as needed, discussed that Pepcid may be a better medication going forward if he  is just using it as needed. 4.  Discussed being on a PPI chronically as it seems he has had reflux issues for years, patient tells me that he had side effects from being on a PPI and his Xanax in the past, for now we will hold off on this. 5.  Encouraged the patient to continue to monitor his weight, if he continues to lose weight without trying and is now eating a normal diet then we may need to complete further work-up 6.  Patient to follow in clinic as  needed with [place] in the future or in March for his colonoscopy.  Ellouise Newer, PA-C [facility] 10/26/2019, 10:52 AM  Cc: [facility] *   10/26/2019 1442  Received records from PCP.  There were no new discussions other than the ones we had in clinic today.  They did discuss a 20 pound weight loss, that was noted patient had not been eating, also discussed intermittent reflux symptoms over the years with increased over the past 3 weeks at that time.  Labs 10/10/2019 showed a normal CBC, CMP with an elevated bilirubin at 1.4 and otherwise normal, it was noted the patient's testosterone was low but he did not want it replenished.    02/15/2019 H. pylori IgG and IgA antibody were normal  No change to recommendations at the moment. Ellouise Newer, PA-C

## 2019-10-26 NOTE — Patient Instructions (Signed)
Continue Nexium as needed for now.   You will be due for a recall colonoscopy in 02/2020. We will send you a reminder in the mail when it gets closer to that time.

## 2020-03-26 ENCOUNTER — Encounter: Payer: Self-pay | Admitting: Internal Medicine

## 2020-04-10 ENCOUNTER — Ambulatory Visit (INDEPENDENT_AMBULATORY_CARE_PROVIDER_SITE_OTHER): Payer: No Typology Code available for payment source | Admitting: Nurse Practitioner

## 2020-04-10 ENCOUNTER — Encounter: Payer: Self-pay | Admitting: Nurse Practitioner

## 2020-04-10 VITALS — BP 108/72 | HR 74 | Temp 97.9°F | Ht 70.0 in | Wt 230.0 lb

## 2020-04-10 DIAGNOSIS — K219 Gastro-esophageal reflux disease without esophagitis: Secondary | ICD-10-CM | POA: Diagnosis not present

## 2020-04-10 DIAGNOSIS — Z86010 Personal history of colonic polyps: Secondary | ICD-10-CM

## 2020-04-10 MED ORDER — NA SULFATE-K SULFATE-MG SULF 17.5-3.13-1.6 GM/177ML PO SOLN: 1.0000 | Freq: Once | ORAL | 0 refills | Status: AC

## 2020-04-10 NOTE — Progress Notes (Signed)
Assessment and plan reviewed 

## 2020-04-10 NOTE — Progress Notes (Signed)
ASSESSMENT / PLAN:    #History of colon polyps, due for surveillance colonoscopy --Patient will be scheduled for a colonoscopy. The risks and benefits of colonoscopy with possible polypectomy / biopsies were discussed and the patient agrees to proceed.   # GERD / esophagitis on EGD 2016 --Having epigastric burning despite antireflux measures which previously were very helpful. However, he is not taking omeprazole on a daily basis.  Patient has been on an antireflux medication through the years.  He says long-term use ends up causing him "problems" but cannot be specific ( maybe lightheadedness).  --Patient has symptomatic GERD.  Explained that in some people antireflux measures alone are insufficient to manage reflux.  He does take Omeprazole but only a few times a week which in his case is proving insufficient. Will go ahead with scheduling an EGD to evaluate refractory symptoms but I don't know that we will find anything new.   --Encouraged him to continue antireflux measures and start taking omeprazole 30 minutes before breakfast every day.  Should there be any adverse effects patient will let us know.   # Anxiety --takes xanax as needed.    HPI:     Chief Complaint: Time for colonoscopy and having stomach pain   Jorge Guerra is a 54 year old male followed here for GERD/esophagitis and history of colon polyps.  He is due for polyp surveillance colonoscopy but came in today to see about getting an upper endoscopy done as well per the [place] request    Patient has a history of GERD and in 2016 underwent EGD with findings of distal esophagitis and a partial large caliber stricture.  He saw Ellouise Newer, P.A in Nov 2020 for severe epigastric pain and weight loss. He has a history of anxiety, it was worse at the time. Per her note patient had an EGD at [facility] in March 2020. I don't see results but esophageal biopsies were negative. At his Nov 2020 visit he was continued  on prn PPI.  Over the years it sounds like he has been on different H2 blockers and PPIs   He currently takes omeprazole but only 3-4 times a week.  Patient cannot remember why he has not stayed on a PPI indefinitely but believes they end up causing him problems when taken for a long period of time.  Again, he cannot remember what those problems are.  He does add that the burning in his distal chest used to be better controlled with elevating the head of bed, going to bed on empty stomach, dietary modifications and PPI.  Now, he is having frequent burning despite these measures.  Also gets bad taste in mouth raising concern for regurgitation . No odynophagia or other GI complaints   Past Medical History:  Diagnosis Date  . Anxiety    situational related to events and work-related  . GERD (gastroesophageal reflux disease)   . Hyperlipidemia   . Nephrolithiasis    Hx of microscopic hematuria.  Negative CT of abd and Pelvis for renal lesion     Past Surgical History:  Procedure Laterality Date  . COLONOSCOPY    . POLYPECTOMY     colon polyps  . TONSILLECTOMY     age 27  . UMBILICAL HERNIA REPAIR     1990's mesh placed  . UMBILICAL HERNIA REPAIR     2005 mesh broke ; has gortex patch   Family History  Problem  Relation Age of Onset  . Leukemia Mother        CLL  . Fibromyalgia Mother   . COPD Father   . Diabetes Father        type II  . Diabetes Other   . Hypertension Other   . Leukemia Brother        2 step brothers  . Leukemia Brother        2 step brothers  . Heart disease Other   . Colon cancer Neg Hx   . Esophageal cancer Neg Hx   . Stomach cancer Neg Hx   . Rectal cancer Neg Hx    Social History   Tobacco Use  . Smoking status: Never Smoker  . Smokeless tobacco: Never Used  Substance Use Topics  . Alcohol use: Yes    Alcohol/week: 0.0 standard drinks    Comment: rare  . Drug use: No   Current Outpatient Medications  Medication Sig Dispense Refill  .  ALPRAZolam (XANAX) 0.5 MG tablet Take 0.5 mg by mouth at bedtime as needed for anxiety. Pt usually only needs once monthly    . esomeprazole (NEXIUM) 20 MG capsule Take 20 mg by mouth daily at 12 noon.    Marland Kitchen zolpidem (AMBIEN) 5 MG tablet Take 5 mg by mouth at bedtime as needed for sleep.    . Vitamin D, Ergocalciferol, (DRISDOL) 1.25 MG (50000 UT) CAPS capsule Take 50,000 Units by mouth every 7 (seven) days.     No current facility-administered medications for this visit.   Allergies  Allergen Reactions  . Other     Steroids panic attacks  . Prednisone   . Statins     Renal failure and elevated liver enzymes     Review of Systems:  All systems reviewed and negative except where noted in HPI.   Creatinine clearance cannot be calculated (Patient's most recent lab result is older than the maximum 21 days allowed.)   Physical Exam:    Wt Readings from Last 3 Encounters:  04/10/20 230 lb (104.3 kg)  10/26/19 210 lb (95.3 kg)  10/15/19 214 lb (97.1 kg)    BP 108/72   Pulse 74   Temp 97.9 F (36.6 C)   Ht 5\' 10"  (1.778 m)   Wt 230 lb (104.3 kg)   BMI 33.00 kg/m  Constitutional:  Pleasant male in no acute distress. Psychiatric: Normal mood and affect. Behavior is normal. EENT: Pupils normal.  Conjunctivae are normal. No scleral icterus. Neck supple.  Cardiovascular: Normal rate, regular rhythm. No edema Pulmonary/chest: Effort normal and breath sounds normal. No wheezing, rales or rhonchi. Abdominal: Soft, nondistended, nontender. Bowel sounds active throughout. There are no masses palpable. No hepatomegaly. Neurological: Alert and oriented to person place and time. Skin: Skin is warm and dry. No rashes noted.  Tye Savoy, NP  04/10/2020, 10:17 AM

## 2020-04-10 NOTE — Patient Instructions (Signed)
If you are age 56 or older, your body mass index should be between 23-30. Your Body mass index is 33 kg/m. If this is out of the aforementioned range listed, please consider follow up with your Primary Care Provider.  If you are age 78 or younger, your body mass index should be between 19-25. Your Body mass index is 33 kg/m. If this is out of the aformentioned range listed, please consider follow up with your Primary Care Provider.   You have been scheduled for an endoscopy and colonoscopy. Please follow the written instructions given to you at your visit today. Please pick up your prep supplies at the pharmacy within the next 1-3 days. If you use inhalers (even only as needed), please bring them with you on the day of your procedure.  Take your Nexium every day as directed.

## 2020-04-29 ENCOUNTER — Telehealth: Payer: Self-pay | Admitting: Internal Medicine

## 2020-04-29 MED ORDER — NA SULFATE-K SULFATE-MG SULF 17.5-3.13-1.6 GM/177ML PO SOLN: 1.0000 | Freq: Once | ORAL | 0 refills | Status: AC

## 2020-04-29 NOTE — Telephone Encounter (Signed)
Jorge Guerra.. appt scheduled for 05-08-20.

## 2020-04-29 NOTE — Telephone Encounter (Signed)
Sent Suprep to [place]. Lm on vm that I had done so.

## 2020-05-08 ENCOUNTER — Encounter: Payer: Self-pay | Admitting: Internal Medicine

## 2020-05-08 ENCOUNTER — Ambulatory Visit (AMBULATORY_SURGERY_CENTER): Payer: No Typology Code available for payment source | Admitting: Internal Medicine

## 2020-05-08 ENCOUNTER — Other Ambulatory Visit: Payer: Self-pay

## 2020-05-08 VITALS — BP 140/86 | HR 63 | Temp 97.3°F | Resp 20 | Ht 70.0 in | Wt 230.0 lb

## 2020-05-08 DIAGNOSIS — K219 Gastro-esophageal reflux disease without esophagitis: Secondary | ICD-10-CM

## 2020-05-08 DIAGNOSIS — K449 Diaphragmatic hernia without obstruction or gangrene: Secondary | ICD-10-CM

## 2020-05-08 DIAGNOSIS — D125 Benign neoplasm of sigmoid colon: Secondary | ICD-10-CM

## 2020-05-08 DIAGNOSIS — K253 Acute gastric ulcer without hemorrhage or perforation: Secondary | ICD-10-CM | POA: Diagnosis not present

## 2020-05-08 DIAGNOSIS — R1013 Epigastric pain: Secondary | ICD-10-CM

## 2020-05-08 DIAGNOSIS — Z86010 Personal history of colonic polyps: Secondary | ICD-10-CM | POA: Diagnosis not present

## 2020-05-08 DIAGNOSIS — D124 Benign neoplasm of descending colon: Secondary | ICD-10-CM | POA: Diagnosis not present

## 2020-05-08 MED ORDER — SODIUM CHLORIDE 0.9 % IV SOLN: 500.0000 mL | Freq: Once | INTRAVENOUS | Status: AC

## 2020-05-08 MED ORDER — PANTOPRAZOLE SODIUM 40 MG PO TBEC: 40.0000 mg | DELAYED_RELEASE_TABLET | Freq: Every day | ORAL | 11 refills | Status: AC

## 2020-05-08 NOTE — Patient Instructions (Signed)
YOU HAD AN ENDOSCOPIC PROCEDURE TODAY AT [facility]:   Refer to the procedure report that was given to you for any specific questions about what was found during the examination.  If the procedure report does not answer your questions, please call your gastroenterologist to clarify.  If you requested that your care partner not be given the details of your procedure findings, then the procedure report has been included in a sealed envelope for you to review at your convenience later.  YOU SHOULD EXPECT: Some feelings of bloating in the abdomen. Passage of more gas than usual.  Walking can help get rid of the air that was put into your GI tract during the procedure and reduce the bloating. If you had a lower endoscopy (such as a colonoscopy or flexible sigmoidoscopy) you may notice spotting of blood in your stool or on the toilet paper. If you underwent a bowel prep for your procedure, you may not have a normal bowel movement for a few days.  Please Note:  You might notice some irritation and congestion in your nose or some drainage.  This is from the oxygen used during your procedure.  There is no need for concern and it should clear up in a day or so.  SYMPTOMS TO REPORT IMMEDIATELY:   Following lower endoscopy (colonoscopy or flexible sigmoidoscopy):  Excessive amounts of blood in the stool  Significant tenderness or worsening of abdominal pains  Swelling of the abdomen that is new, acute  Fever of 100F or higher   Following upper endoscopy (EGD)  Vomiting of blood or coffee ground material  New chest pain or pain under the shoulder blades  Painful or persistently difficult swallowing  New shortness of breath  Fever of 100F or higher  Black, tarry-looking stools  For urgent or emergent issues, a gastroenterologist can be reached at any hour by calling [phone]. Do not use MyChart messaging for urgent concerns.    DIET:  We do recommend a small meal at first, but  then you may proceed to your regular diet.  Drink plenty of fluids but you should avoid alcoholic beverages for 24 hours.  MEDICATIONS: Continue present medications. Start Pantoprazole 40 mg by mouth daily (#30, 11 refills). This prescription has been sent to your pharmacy.  Please see handouts given to you by your recovery nurse.  ACTIVITY:  You should plan to take it easy for the rest of today and you should NOT DRIVE or use heavy machinery until tomorrow (because of the sedation medicines used during the test).    FOLLOW UP: Our staff will call the number listed on your records 48-72 hours following your procedure to check on you and address any questions or concerns that you may have regarding the information given to you following your procedure. If we do not reach you, we will leave a message.  We will attempt to reach you two times.  During this call, we will ask if you have developed any symptoms of COVID 19. If you develop any symptoms (ie: fever, flu-like symptoms, shortness of breath, cough etc.) before then, please call [phone].  If you test positive for Covid 19 in the 2 weeks post procedure, please call and report this information to [place].    If any biopsies were taken you will be contacted by phone or by letter within the next 1-3 weeks.  Please call us at [phone] if you have not heard about the biopsies in 3 weeks.  Thank you for allowing [place] to provide for your healthcare needs today.   SIGNATURES/CONFIDENTIALITY: You and/or your care partner have signed paperwork which will be entered into your electronic medical record.  These signatures attest to the fact that that the information above on your After Visit Summary has been reviewed and is understood.  Full responsibility of the confidentiality of this discharge information lies with you and/or your care-partner.

## 2020-05-08 NOTE — Progress Notes (Signed)
A/ox3, pleased with MAC, report to RN 

## 2020-05-08 NOTE — Progress Notes (Signed)
Called to room to assist during endoscopic procedure.  Patient ID and intended procedure confirmed with present staff. Received instructions for my participation in the procedure from the performing physician.  

## 2020-05-08 NOTE — Progress Notes (Signed)
Pt's states no medical or surgical changes since previsit or office visit.  Temp- June Vitals- Courtney 

## 2020-05-08 NOTE — Op Note (Signed)
[facility] Patient Name: Jorge Guerra Procedure Date: 05/08/2020 2:43 PM MRN: [id number] Endoscopist: Docia Chuck. Henrene Pastor , MD Age: 54 Referring MD:  Date of Birth: [birth year] Gender: Male Account #: [id number] Procedure:                Upper GI endoscopy Indications:              Epigastric abdominal pain, Esophageal reflux Medicines:                Monitored Anesthesia Care Procedure:                Pre-Anesthesia Assessment:                           - Prior to the procedure, a History and Physical                            was performed, and patient medications and                            allergies were reviewed. The patient's tolerance of                            previous anesthesia was also reviewed. The risks                            and benefits of the procedure and the sedation                            options and risks were discussed with the patient.                            All questions were answered, and informed consent                            was obtained. Prior Anticoagulants: The patient has                            taken no previous anticoagulant or antiplatelet                            agents. ASA Grade Assessment: II - A patient with                            mild systemic disease. After reviewing the risks                            and benefits, the patient was deemed in                            satisfactory condition to undergo the procedure.                           After obtaining informed consent, the endoscope was  passed under direct vision. Throughout the                            procedure, the patient's blood pressure, pulse, and                            oxygen saturations were monitored continuously. The                            Endoscope was introduced through the mouth, and                            advanced to the third part of duodenum. The upper                            GI endoscopy was  accomplished without difficulty.                            The patient tolerated the procedure well. Scope In: Scope Out: Findings:                 The esophagus was normal.                           The stomach revealed a small hiatal hernia and                            several diminutive antral erosions. Biopsies were                            taken with a cold forceps for Helicobacter pylori                            testing using CLOtest.                           The examined duodenum was normal with questionable                            mild deformity between the bulb and second portion.                           The cardia and gastric fundus were normal on                            retroflexion. Complications:            No immediate complications. Estimated Blood Loss:     Estimated blood loss: none. Impression:               1. GERD                           2. Few antral erosions                           3. Otherwise unremarkable EGD.  Recommendation:           - Patient has a contact number available for                            emergencies. The signs and symptoms of potential                            delayed complications were discussed with the                            patient. Return to normal activities tomorrow.                            Written discharge instructions were provided to the                            patient.                           - Resume previous diet.                           - Continue present medications.                           - Await pathology results.                           - PRESCRIBE PANTOPRAZOLE 40 mg daily; #30; 11                            refills Leandrea Ackley N. Henrene Pastor, MD 05/08/2020 3:29:38 PM This report has been signed electronically.

## 2020-05-08 NOTE — Op Note (Signed)
[facility] Patient Name: Jorge Guerra Procedure Date: 05/08/2020 2:44 PM MRN: [id number] Endoscopist: Docia Chuck. Henrene Pastor , MD Age: 54 Referring MD:  Date of Birth: 09-13-1966 Gender: Male Account #: [id number] Procedure:                Colonoscopy with cold snare polypectomy x 3 Indications:              High risk colon cancer surveillance: Personal                            history of adenoma (10 mm or greater in size).                            Previous examinations 2011, 2016 Medicines:                Monitored Anesthesia Care Procedure:                Pre-Anesthesia Assessment:                           - Prior to the procedure, a History and Physical                            was performed, and patient medications and                            allergies were reviewed. The patient's tolerance of                            previous anesthesia was also reviewed. The risks                            and benefits of the procedure and the sedation                            options and risks were discussed with the patient.                            All questions were answered, and informed consent                            was obtained. Prior Anticoagulants: The patient has                            taken no previous anticoagulant or antiplatelet                            agents. ASA Grade Assessment: II - A patient with                            mild systemic disease. After reviewing the risks                            and benefits, the patient was deemed in  satisfactory condition to undergo the procedure.                           After obtaining informed consent, the colonoscope                            was passed under direct vision. Throughout the                            procedure, the patient's blood pressure, pulse, and                            oxygen saturations were monitored continuously. The   Colonoscope was introduced through the anus and                            advanced to the the cecum, identified by                            appendiceal orifice and ileocecal valve. The                            ileocecal valve, appendiceal orifice, and rectum                            were photographed. The quality of the bowel                            preparation was good. The colonoscopy was performed                            without difficulty. The patient tolerated the                            procedure well. The bowel preparation used was                            SUPREP via split dose instruction. Scope In: 2:57:00 PM Scope Out: 3:13:25 PM Scope Withdrawal Time: 0 hours 14 minutes 53 seconds  Total Procedure Duration: 0 hours 16 minutes 25 seconds  Findings:                 Three polyps were found in the sigmoid colon and                            descending colon. The polyps were 2 to 3 mm in                            size. These polyps were removed with a cold snare.                            Resection and retrieval were complete.  Multiple diverticula were found in the sigmoid                            colon and right colon.                           Internal hemorrhoids were found during retroflexion.                           The exam was otherwise without abnormality on                            direct and retroflexion views. Complications:            No immediate complications. Estimated blood loss:                            None. Estimated Blood Loss:     Estimated blood loss: none. Impression:               - Three 2 to 3 mm polyps in the sigmoid colon and                            in the descending colon, removed with a cold snare.                            Resected and retrieved.                           - Diverticulosis in the sigmoid colon and in the                            right colon.                           -  Internal hemorrhoids.                           - The examination was otherwise normal on direct                            and retroflexion views. Recommendation:           - Repeat colonoscopy in 5 years for surveillance.                           - Patient has a contact number available for                            emergencies. The signs and symptoms of potential                            delayed complications were discussed with the                            patient. Return to normal activities tomorrow.  Written discharge instructions were provided to the                            patient.                           - Resume previous diet.                           - Continue present medications.                           - Await pathology results. Docia Chuck. Henrene Pastor, MD 05/08/2020 3:18:18 PM This report has been signed electronically.

## 2020-05-09 LAB — HELICOBACTER PYLORI SCREEN-BIOPSY: UREASE: NEGATIVE

## 2020-05-10 ENCOUNTER — Telehealth: Payer: Self-pay

## 2020-05-10 ENCOUNTER — Telehealth: Payer: Self-pay | Admitting: *Deleted

## 2020-05-10 NOTE — Telephone Encounter (Signed)
First attempt, left VM.  

## 2020-05-10 NOTE — Telephone Encounter (Signed)
  Follow up Call-  Call back number 05/08/2020  Post procedure Call Back phone  # [phone]  Permission to leave phone message Yes  Some recent data might be hidden     Patient questions:  Do you have a fever, pain , or abdominal swelling? No. Pain Score  0 *  Have you tolerated food without any problems? Yes.    Have you been able to return to your normal activities? Yes.    Do you have any questions about your discharge instructions: Diet   No. Medications  No. Follow up visit  No.  Do you have questions or concerns about your Care? No.  Actions: * If pain score is 4 or above: No action needed, pain <4.  1. Have you developed a fever since your procedure? no  2.   Have you had an respiratory symptoms (SOB or cough) since your procedure? no  3.   Have you tested positive for COVID 19 since your procedure no  4.   Have you had any family members/close contacts diagnosed with the COVID 19 since your procedure?  no   If yes to any of these questions please route to Joylene John, RN and Erenest Rasher, RN

## 2020-05-15 ENCOUNTER — Telehealth: Payer: Self-pay | Admitting: Internal Medicine

## 2020-05-15 NOTE — Telephone Encounter (Signed)
Pt calling for path results, let him know Dr. Henrene Pastor is out of the office and we will notify him once he has reviewed the results.

## 2020-05-17 IMAGING — CT CT ABD-PELV W/ CM
2 of 5 series · 16 of 46 positions shown, 18 images · IV contrast (Omnipaque)
Comparison: 02/20/2019

CLINICAL DATA: Abdominal pain.  Burning for 1 week after eating

EXAM:
CT ABDOMEN AND PELVIS WITH CONTRAST
TECHNIQUE: Multidetector CT imaging of the abdomen and pelvis was performed
using the standard protocol following bolus administration of
intravenous contrast.
CONTRAST:  100mL OMNIPAQUE IOHEXOL 300 MG/ML  SOLN

[Series 2: axial st · axial · 0.94mm/px · z∈[-476,-42]mm · 13 of 99 slices shown, 15 images]
[im 6/99  soft-tissue]
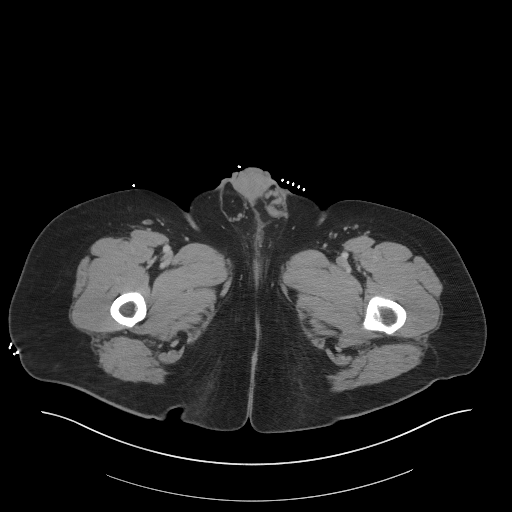
[im 6/99  bone]
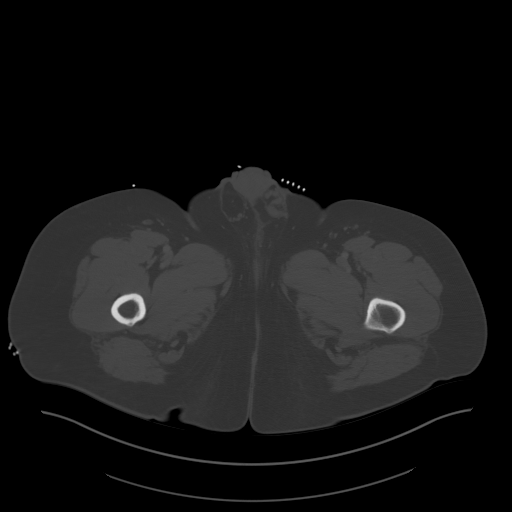
[im 11/99  soft-tissue]
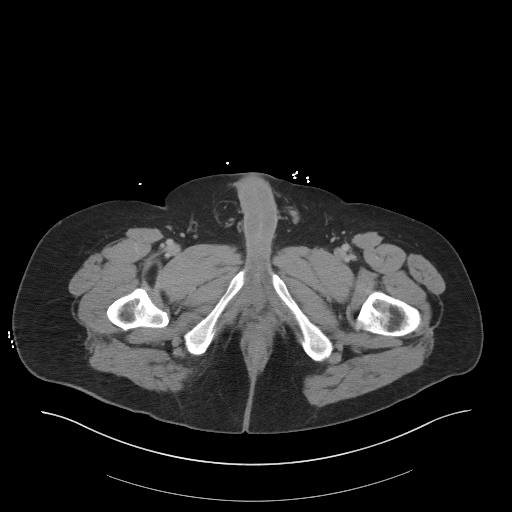
[im 22/99  soft-tissue]
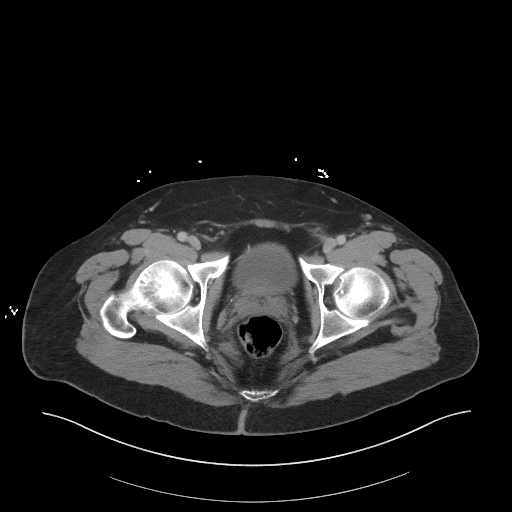
[im 28/99  soft-tissue]
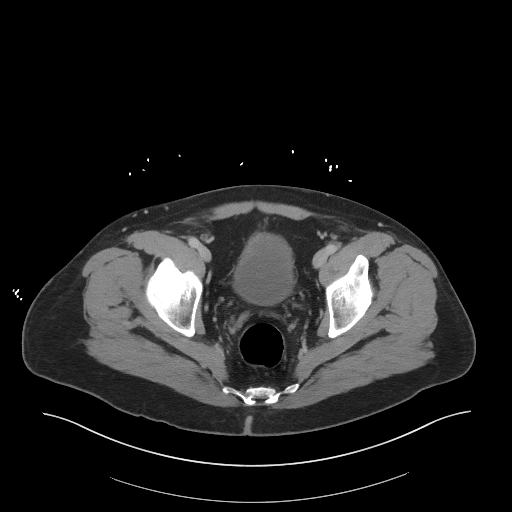
[im 33/99  soft-tissue]
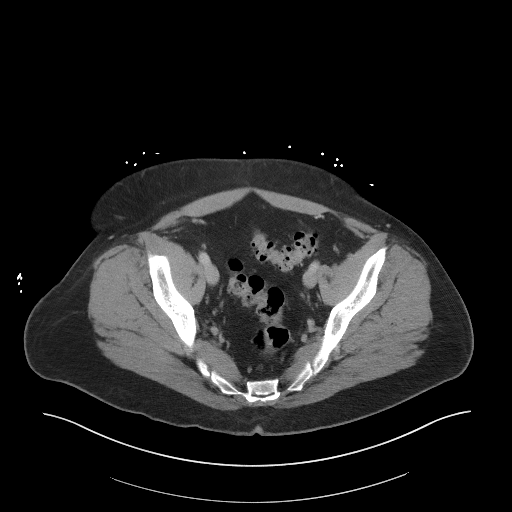
[im 44/99  soft-tissue]
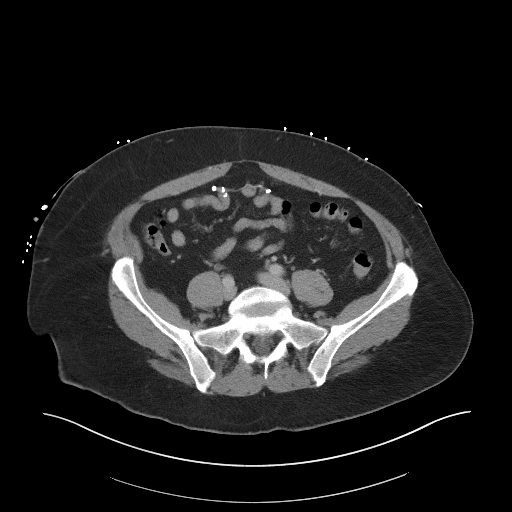
[im 50/99  soft-tissue]
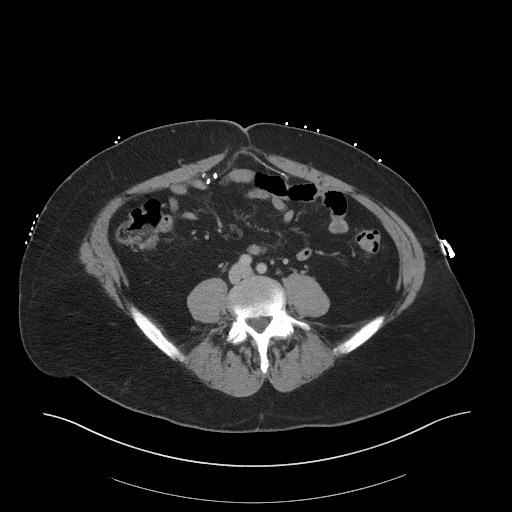
[im 55/99  soft-tissue]
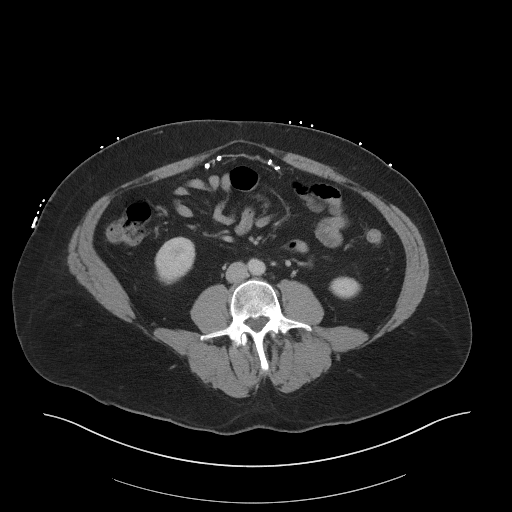
[im 66/99  soft-tissue]
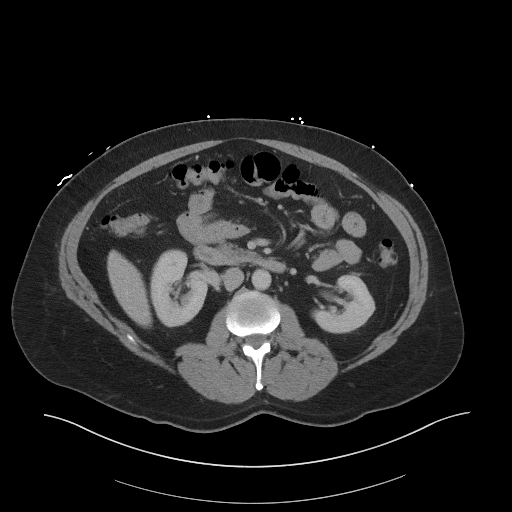
[im 66/99  bone]
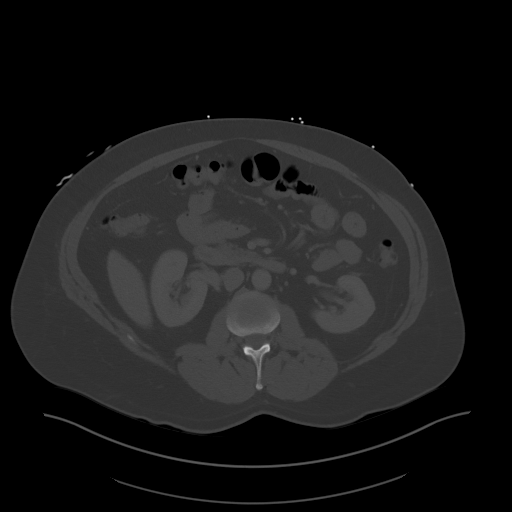
[im 71/99  soft-tissue]
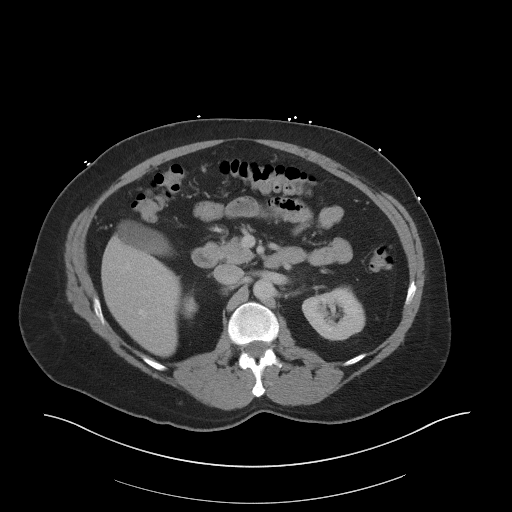
[im 77/99  soft-tissue]
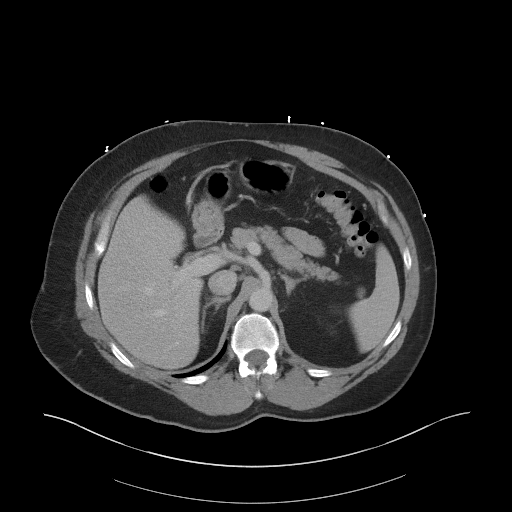
[im 88/99  soft-tissue]
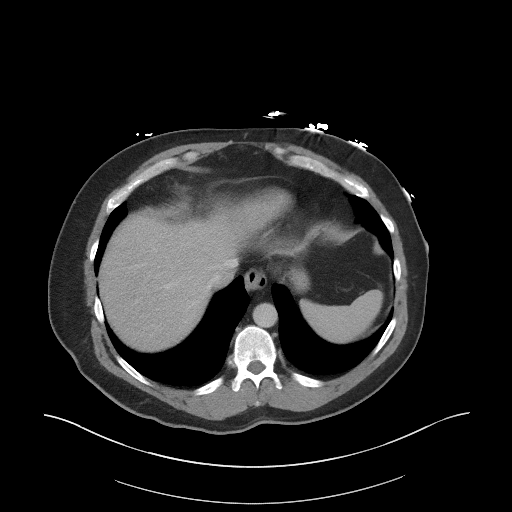
[im 93/99  soft-tissue]
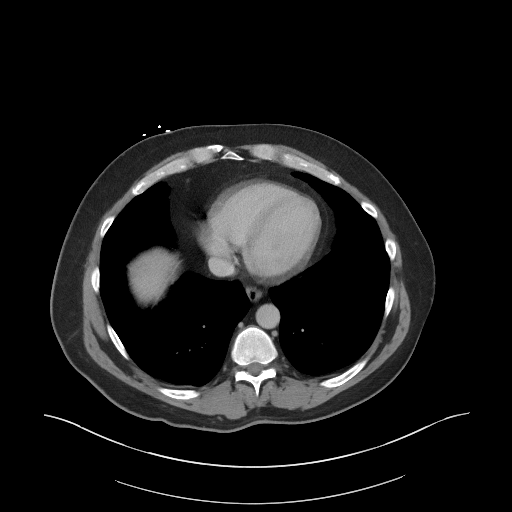

[Series 5: coronal st · coronal · 0.92mm/px · 3 of 99 slices shown]
[im 33/99  soft-tissue]
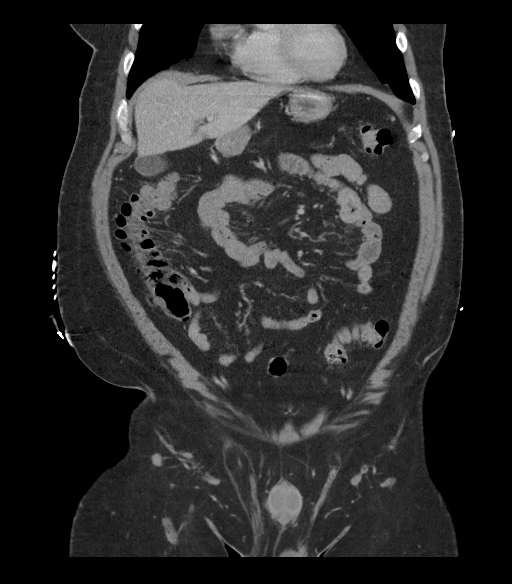
[im 44/99  soft-tissue]
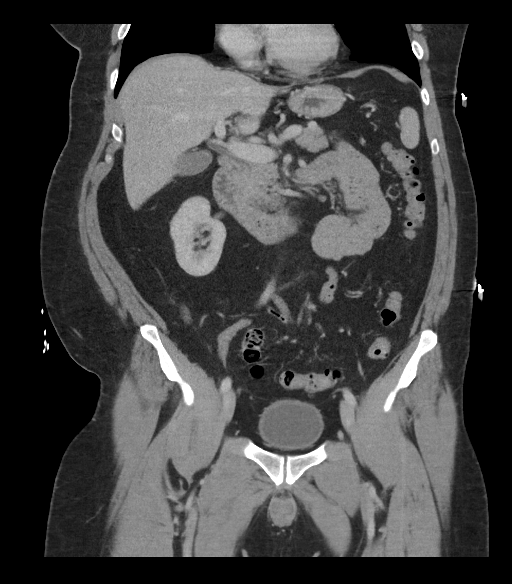
[im 55/99  soft-tissue]
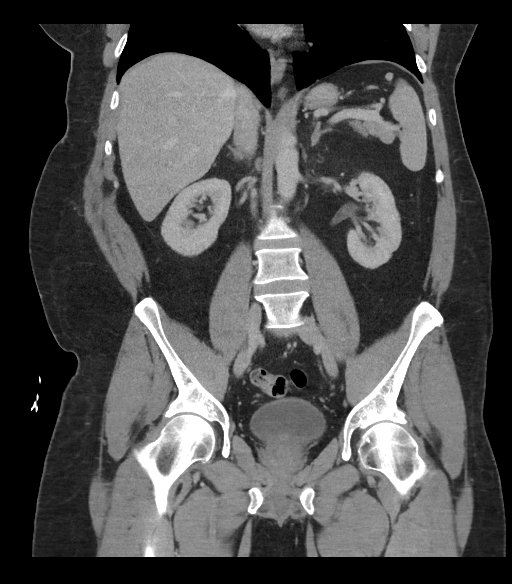

[16 of 46 positions shown; findings below may reference images not displayed]

FINDINGS: Lower chest: No acute abnormality.

Hepatobiliary: No focal liver abnormality is seen. No gallstones,
gallbladder wall thickening, or biliary dilatation.

Pancreas: Unremarkable. No pancreatic ductal dilatation or
surrounding inflammatory changes.

Spleen: Normal in size without focal abnormality.

Adrenals/Urinary Tract: Adrenal glands are unremarkable. Kidneys are
normal, without renal calculi, focal lesion, or hydronephrosis.
Bladder is unremarkable.

Stomach/Bowel: Stomach is within normal limits. Appendix appears
normal. No evidence of bowel wall thickening, distention, or
inflammatory changes. Diverticulosis without evidence of
diverticulitis.

Vascular/Lymphatic: No significant vascular findings are present. No
enlarged abdominal or pelvic lymph nodes.

Reproductive: Prostate is unremarkable.

Other: Fat containing right inguinal hernia. Prior ventral abdominal
hernia repair with a mesh in place. No ascites.

Musculoskeletal: No acute osseous abnormality. No aggressive osseous
lesion.
IMPRESSION: 1. No acute abdominal or pelvic pathology.
2. Normal appendix.
3. Diverticulosis without evidence of diverticulitis.
4. No urolithiasis or obstructive uropathy.

## 2020-05-20 ENCOUNTER — Encounter: Payer: Self-pay | Admitting: Internal Medicine

## 2021-01-21 DEATH — deceased
